# Patient Record
Sex: Male | Born: 1970 | Race: White | Hispanic: Yes | Smoking: Never smoker
Health system: Southern US, Community
[De-identification: ages and names within clinical notes are randomized; demographics above are authoritative.]

---

## 2019-08-29 ENCOUNTER — Emergency Department (HOSPITAL_COMMUNITY): Payer: Medicaid Other

## 2019-08-29 ENCOUNTER — Other Ambulatory Visit: Payer: Self-pay

## 2019-08-29 ENCOUNTER — Encounter (HOSPITAL_COMMUNITY): Payer: Self-pay

## 2019-08-29 ENCOUNTER — Inpatient Hospital Stay (HOSPITAL_COMMUNITY)
Admission: EM | Admit: 2019-08-29 | Discharge: 2019-09-09 | DRG: 870 | Disposition: E | Payer: Medicaid Other | Attending: Critical Care Medicine | Admitting: Critical Care Medicine

## 2019-08-29 DIAGNOSIS — J189 Pneumonia, unspecified organism: Secondary | ICD-10-CM

## 2019-08-29 DIAGNOSIS — I309 Acute pericarditis, unspecified: Secondary | ICD-10-CM | POA: Diagnosis present

## 2019-08-29 DIAGNOSIS — I248 Other forms of acute ischemic heart disease: Secondary | ICD-10-CM | POA: Diagnosis present

## 2019-08-29 DIAGNOSIS — R0902 Hypoxemia: Secondary | ICD-10-CM

## 2019-08-29 DIAGNOSIS — I4901 Ventricular fibrillation: Secondary | ICD-10-CM | POA: Diagnosis not present

## 2019-08-29 DIAGNOSIS — D649 Anemia, unspecified: Secondary | ICD-10-CM | POA: Diagnosis present

## 2019-08-29 DIAGNOSIS — G8929 Other chronic pain: Secondary | ICD-10-CM | POA: Diagnosis present

## 2019-08-29 DIAGNOSIS — T380X5A Adverse effect of glucocorticoids and synthetic analogues, initial encounter: Secondary | ICD-10-CM | POA: Diagnosis not present

## 2019-08-29 DIAGNOSIS — J9383 Other pneumothorax: Secondary | ICD-10-CM | POA: Diagnosis not present

## 2019-08-29 DIAGNOSIS — E871 Hypo-osmolality and hyponatremia: Secondary | ICD-10-CM | POA: Diagnosis present

## 2019-08-29 DIAGNOSIS — F1120 Opioid dependence, uncomplicated: Secondary | ICD-10-CM | POA: Diagnosis present

## 2019-08-29 DIAGNOSIS — R0689 Other abnormalities of breathing: Secondary | ICD-10-CM

## 2019-08-29 DIAGNOSIS — I301 Infective pericarditis: Secondary | ICD-10-CM | POA: Diagnosis present

## 2019-08-29 DIAGNOSIS — E873 Alkalosis: Secondary | ICD-10-CM | POA: Diagnosis present

## 2019-08-29 DIAGNOSIS — R34 Anuria and oliguria: Secondary | ICD-10-CM | POA: Diagnosis not present

## 2019-08-29 DIAGNOSIS — Z9689 Presence of other specified functional implants: Secondary | ICD-10-CM

## 2019-08-29 DIAGNOSIS — I2690 Septic pulmonary embolism without acute cor pulmonale: Secondary | ICD-10-CM | POA: Diagnosis present

## 2019-08-29 DIAGNOSIS — J9601 Acute respiratory failure with hypoxia: Secondary | ICD-10-CM | POA: Diagnosis present

## 2019-08-29 DIAGNOSIS — R0603 Acute respiratory distress: Secondary | ICD-10-CM

## 2019-08-29 DIAGNOSIS — I469 Cardiac arrest, cause unspecified: Secondary | ICD-10-CM | POA: Diagnosis not present

## 2019-08-29 DIAGNOSIS — I76 Septic arterial embolism: Secondary | ICD-10-CM | POA: Diagnosis present

## 2019-08-29 DIAGNOSIS — E872 Acidosis, unspecified: Secondary | ICD-10-CM

## 2019-08-29 DIAGNOSIS — R6520 Severe sepsis without septic shock: Secondary | ICD-10-CM

## 2019-08-29 DIAGNOSIS — J15212 Pneumonia due to Methicillin resistant Staphylococcus aureus: Secondary | ICD-10-CM | POA: Diagnosis present

## 2019-08-29 DIAGNOSIS — Z781 Physical restraint status: Secondary | ICD-10-CM

## 2019-08-29 DIAGNOSIS — J9600 Acute respiratory failure, unspecified whether with hypoxia or hypercapnia: Secondary | ICD-10-CM

## 2019-08-29 DIAGNOSIS — Z20828 Contact with and (suspected) exposure to other viral communicable diseases: Secondary | ICD-10-CM | POA: Diagnosis present

## 2019-08-29 DIAGNOSIS — R7989 Other specified abnormal findings of blood chemistry: Secondary | ICD-10-CM

## 2019-08-29 DIAGNOSIS — E875 Hyperkalemia: Secondary | ICD-10-CM | POA: Diagnosis not present

## 2019-08-29 DIAGNOSIS — E870 Hyperosmolality and hypernatremia: Secondary | ICD-10-CM | POA: Diagnosis not present

## 2019-08-29 DIAGNOSIS — E861 Hypovolemia: Secondary | ICD-10-CM | POA: Diagnosis present

## 2019-08-29 DIAGNOSIS — F1990 Other psychoactive substance use, unspecified, uncomplicated: Secondary | ICD-10-CM

## 2019-08-29 DIAGNOSIS — I480 Paroxysmal atrial fibrillation: Secondary | ICD-10-CM | POA: Diagnosis present

## 2019-08-29 DIAGNOSIS — E860 Dehydration: Secondary | ICD-10-CM | POA: Diagnosis present

## 2019-08-29 DIAGNOSIS — N170 Acute kidney failure with tubular necrosis: Secondary | ICD-10-CM | POA: Diagnosis present

## 2019-08-29 DIAGNOSIS — K5900 Constipation, unspecified: Secondary | ICD-10-CM | POA: Diagnosis not present

## 2019-08-29 DIAGNOSIS — J90 Pleural effusion, not elsewhere classified: Secondary | ICD-10-CM | POA: Diagnosis present

## 2019-08-29 DIAGNOSIS — Z20822 Contact with and (suspected) exposure to covid-19: Secondary | ICD-10-CM | POA: Diagnosis present

## 2019-08-29 DIAGNOSIS — R042 Hemoptysis: Secondary | ICD-10-CM | POA: Diagnosis present

## 2019-08-29 DIAGNOSIS — R7881 Bacteremia: Secondary | ICD-10-CM

## 2019-08-29 DIAGNOSIS — A419 Sepsis, unspecified organism: Secondary | ICD-10-CM

## 2019-08-29 DIAGNOSIS — B9710 Unspecified enterovirus as the cause of diseases classified elsewhere: Secondary | ICD-10-CM | POA: Diagnosis present

## 2019-08-29 DIAGNOSIS — Z86711 Personal history of pulmonary embolism: Secondary | ICD-10-CM

## 2019-08-29 DIAGNOSIS — I409 Acute myocarditis, unspecified: Secondary | ICD-10-CM

## 2019-08-29 DIAGNOSIS — N19 Unspecified kidney failure: Secondary | ICD-10-CM

## 2019-08-29 DIAGNOSIS — K7200 Acute and subacute hepatic failure without coma: Secondary | ICD-10-CM | POA: Diagnosis present

## 2019-08-29 DIAGNOSIS — L8990 Pressure ulcer of unspecified site, unspecified stage: Secondary | ICD-10-CM | POA: Insufficient documentation

## 2019-08-29 DIAGNOSIS — R739 Hyperglycemia, unspecified: Secondary | ICD-10-CM | POA: Diagnosis not present

## 2019-08-29 DIAGNOSIS — R339 Retention of urine, unspecified: Secondary | ICD-10-CM | POA: Diagnosis not present

## 2019-08-29 DIAGNOSIS — A4102 Sepsis due to Methicillin resistant Staphylococcus aureus: Principal | ICD-10-CM | POA: Diagnosis present

## 2019-08-29 DIAGNOSIS — R6521 Severe sepsis with septic shock: Secondary | ICD-10-CM | POA: Diagnosis present

## 2019-08-29 DIAGNOSIS — J939 Pneumothorax, unspecified: Secondary | ICD-10-CM

## 2019-08-29 LAB — CBC WITH DIFFERENTIAL/PLATELET
Abs Immature Granulocytes: 0 10*3/uL (ref 0.00–0.07)
Basophils Absolute: 0 10*3/uL (ref 0.0–0.1)
Basophils Relative: 0 %
Eosinophils Absolute: 0 10*3/uL (ref 0.0–0.5)
Eosinophils Relative: 0 %
HCT: 43.8 % (ref 39.0–52.0)
Hemoglobin: 15.1 g/dL (ref 13.0–17.0)
Lymphocytes Relative: 9 %
Lymphs Abs: 2.1 10*3/uL (ref 0.7–4.0)
MCH: 31.2 pg (ref 26.0–34.0)
MCHC: 34.5 g/dL (ref 30.0–36.0)
MCV: 90.5 fL (ref 80.0–100.0)
Monocytes Absolute: 1.4 10*3/uL — ABNORMAL HIGH (ref 0.1–1.0)
Monocytes Relative: 6 %
Neutro Abs: 20.2 10*3/uL — ABNORMAL HIGH (ref 1.7–7.7)
Neutrophils Relative %: 85 %
Platelets: 281 10*3/uL (ref 150–400)
RBC: 4.84 MIL/uL (ref 4.22–5.81)
RDW: 13.3 % (ref 11.5–15.5)
WBC: 23.8 10*3/uL — ABNORMAL HIGH (ref 4.0–10.5)
nRBC: 0 % (ref 0.0–0.2)
nRBC: 0 /100 WBC

## 2019-08-29 LAB — COMPREHENSIVE METABOLIC PANEL
ALT: 66 U/L — ABNORMAL HIGH (ref 0–44)
AST: 65 U/L — ABNORMAL HIGH (ref 15–41)
Albumin: 2.4 g/dL — ABNORMAL LOW (ref 3.5–5.0)
Alkaline Phosphatase: 128 U/L — ABNORMAL HIGH (ref 38–126)
Anion gap: 16 — ABNORMAL HIGH (ref 5–15)
BUN: 41 mg/dL — ABNORMAL HIGH (ref 6–20)
CO2: 22 mmol/L (ref 22–32)
Calcium: 8.8 mg/dL — ABNORMAL LOW (ref 8.9–10.3)
Chloride: 90 mmol/L — ABNORMAL LOW (ref 98–111)
Creatinine, Ser: 2.7 mg/dL — ABNORMAL HIGH (ref 0.61–1.24)
GFR calc Af Amer: 31 mL/min — ABNORMAL LOW (ref >60)
GFR calc non Af Amer: 27 mL/min — ABNORMAL LOW (ref >60)
Glucose, Bld: 176 mg/dL — ABNORMAL HIGH (ref 70–99)
Potassium: 4.4 mmol/L (ref 3.5–5.1)
Sodium: 128 mmol/L — ABNORMAL LOW (ref 135–145)
Total Bilirubin: 4.8 mg/dL — ABNORMAL HIGH (ref 0.3–1.2)
Total Protein: 7.9 g/dL (ref 6.5–8.1)

## 2019-08-29 LAB — LACTIC ACID, PLASMA
Lactic Acid, Venous: 2.8 mmol/L (ref 0.5–1.9)
Lactic Acid, Venous: 2.6 mmol/L (ref 0.5–1.9)

## 2019-08-29 LAB — PROTIME-INR
INR: 1.4 — ABNORMAL HIGH (ref 0.8–1.2)
Prothrombin Time: 16.8 seconds — ABNORMAL HIGH (ref 11.4–15.2)

## 2019-08-29 LAB — URINALYSIS, ROUTINE W REFLEX MICROSCOPIC
Bacteria, UA: NONE SEEN
Bilirubin Urine: NEGATIVE
Glucose, UA: NEGATIVE mg/dL
Ketones, ur: NEGATIVE mg/dL
Nitrite: NEGATIVE
Protein, ur: 100 mg/dL — AB
Specific Gravity, Urine: 1.019 (ref 1.005–1.030)
WBC, UA: >50 WBC/hpf — ABNORMAL HIGH (ref 0–5)
pH: 5 (ref 5.0–8.0)

## 2019-08-29 LAB — TROPONIN I (HIGH SENSITIVITY): Troponin I (High Sensitivity): 17 ng/L (ref <18)

## 2019-08-29 LAB — CBG MONITORING, ED: Glucose-Capillary: 173 mg/dL — ABNORMAL HIGH (ref 70–99)

## 2019-08-29 LAB — LIPASE, BLOOD: Lipase: 173 U/L — ABNORMAL HIGH (ref 11–51)

## 2019-08-29 LAB — APTT: aPTT: 44 seconds — ABNORMAL HIGH (ref 24–36)

## 2019-08-29 MED ORDER — SODIUM CHLORIDE 0.9 % IV SOLN
500.0000 mg | INTRAVENOUS | Status: DC
  Administered 2019-08-29: 20:00:00 500 mg via INTRAVENOUS
  Filled 2019-08-29: qty 500

## 2019-08-29 MED ORDER — LACTATED RINGERS IV BOLUS (SEPSIS)
500.0000 mL | Freq: Once | INTRAVENOUS | Status: AC
  Administered 2019-08-29: 20:00:00 500 mL via INTRAVENOUS

## 2019-08-29 MED ORDER — LACTATED RINGERS IV BOLUS (SEPSIS)
1000.0000 mL | Freq: Once | INTRAVENOUS | Status: AC
  Administered 2019-08-29: 1000 mL via INTRAVENOUS

## 2019-08-29 MED ORDER — SODIUM CHLORIDE 0.9 % IV SOLN
2.0000 g | INTRAVENOUS | Status: DC
  Administered 2019-08-29: 2 g via INTRAVENOUS
  Filled 2019-08-29: qty 20

## 2019-08-29 MED ORDER — ONDANSETRON HCL 4 MG/2ML IJ SOLN
4.0000 mg | Freq: Once | INTRAMUSCULAR | Status: AC
  Administered 2019-08-29: 20:00:00 4 mg via INTRAVENOUS
  Filled 2019-08-29: qty 2

## 2019-08-29 MED ORDER — MORPHINE SULFATE (PF) 4 MG/ML IV SOLN
4.0000 mg | Freq: Once | INTRAVENOUS | Status: AC
  Administered 2019-08-29: 4 mg via INTRAVENOUS
  Filled 2019-08-29: qty 1

## 2019-08-29 NOTE — ED Notes (Signed)
Called lab to add on APTT and lipase

## 2019-08-29 NOTE — ED Notes (Signed)
Patient transported to CT 

## 2019-08-29 NOTE — ED Notes (Signed)
Pt given urinal and advised of the need for urine

## 2019-08-29 NOTE — ED Notes (Signed)
ED Provider at bedside. 

## 2019-08-29 NOTE — ED Triage Notes (Signed)
Pt reports right sided chest pain and shortness of breath for 3 days, was told by his friend that he had pneumonia and needed to come here. Pt diaphoretic, hypotensive and tachycardic in triage. 18G IV placed in RAC.

## 2019-08-30 ENCOUNTER — Encounter (HOSPITAL_COMMUNITY): Payer: Self-pay | Admitting: Internal Medicine

## 2019-08-30 ENCOUNTER — Inpatient Hospital Stay (HOSPITAL_COMMUNITY): Payer: Medicaid Other

## 2019-08-30 ENCOUNTER — Other Ambulatory Visit (HOSPITAL_COMMUNITY): Payer: Self-pay

## 2019-08-30 DIAGNOSIS — I330 Acute and subacute infective endocarditis: Secondary | ICD-10-CM

## 2019-08-30 DIAGNOSIS — K7200 Acute and subacute hepatic failure without coma: Secondary | ICD-10-CM | POA: Diagnosis present

## 2019-08-30 DIAGNOSIS — I309 Acute pericarditis, unspecified: Secondary | ICD-10-CM | POA: Diagnosis present

## 2019-08-30 DIAGNOSIS — G8929 Other chronic pain: Secondary | ICD-10-CM | POA: Diagnosis present

## 2019-08-30 DIAGNOSIS — R042 Hemoptysis: Secondary | ICD-10-CM

## 2019-08-30 DIAGNOSIS — R0603 Acute respiratory distress: Secondary | ICD-10-CM

## 2019-08-30 DIAGNOSIS — A419 Sepsis, unspecified organism: Secondary | ICD-10-CM | POA: Diagnosis not present

## 2019-08-30 DIAGNOSIS — R6521 Severe sepsis with septic shock: Secondary | ICD-10-CM | POA: Diagnosis present

## 2019-08-30 DIAGNOSIS — I76 Septic arterial embolism: Secondary | ICD-10-CM | POA: Diagnosis present

## 2019-08-30 DIAGNOSIS — R6520 Severe sepsis without septic shock: Secondary | ICD-10-CM

## 2019-08-30 DIAGNOSIS — E860 Dehydration: Secondary | ICD-10-CM | POA: Diagnosis present

## 2019-08-30 DIAGNOSIS — J90 Pleural effusion, not elsewhere classified: Secondary | ICD-10-CM | POA: Diagnosis present

## 2019-08-30 DIAGNOSIS — J15212 Pneumonia due to Methicillin resistant Staphylococcus aureus: Secondary | ICD-10-CM | POA: Diagnosis present

## 2019-08-30 DIAGNOSIS — R739 Hyperglycemia, unspecified: Secondary | ICD-10-CM | POA: Diagnosis not present

## 2019-08-30 DIAGNOSIS — Z86711 Personal history of pulmonary embolism: Secondary | ICD-10-CM | POA: Diagnosis not present

## 2019-08-30 DIAGNOSIS — N179 Acute kidney failure, unspecified: Secondary | ICD-10-CM | POA: Diagnosis not present

## 2019-08-30 DIAGNOSIS — R579 Shock, unspecified: Secondary | ICD-10-CM | POA: Diagnosis not present

## 2019-08-30 DIAGNOSIS — N170 Acute kidney failure with tubular necrosis: Secondary | ICD-10-CM | POA: Diagnosis present

## 2019-08-30 DIAGNOSIS — J9601 Acute respiratory failure with hypoxia: Secondary | ICD-10-CM | POA: Diagnosis present

## 2019-08-30 DIAGNOSIS — E871 Hypo-osmolality and hyponatremia: Secondary | ICD-10-CM | POA: Diagnosis present

## 2019-08-30 DIAGNOSIS — E873 Alkalosis: Secondary | ICD-10-CM | POA: Diagnosis present

## 2019-08-30 DIAGNOSIS — E872 Acidosis, unspecified: Secondary | ICD-10-CM | POA: Insufficient documentation

## 2019-08-30 DIAGNOSIS — J189 Pneumonia, unspecified organism: Secondary | ICD-10-CM | POA: Diagnosis not present

## 2019-08-30 DIAGNOSIS — F1120 Opioid dependence, uncomplicated: Secondary | ICD-10-CM | POA: Diagnosis present

## 2019-08-30 DIAGNOSIS — R7881 Bacteremia: Secondary | ICD-10-CM | POA: Diagnosis not present

## 2019-08-30 DIAGNOSIS — J9383 Other pneumothorax: Secondary | ICD-10-CM | POA: Diagnosis not present

## 2019-08-30 DIAGNOSIS — I301 Infective pericarditis: Secondary | ICD-10-CM | POA: Diagnosis present

## 2019-08-30 DIAGNOSIS — A4102 Sepsis due to Methicillin resistant Staphylococcus aureus: Secondary | ICD-10-CM | POA: Diagnosis present

## 2019-08-30 DIAGNOSIS — Z20828 Contact with and (suspected) exposure to other viral communicable diseases: Secondary | ICD-10-CM | POA: Diagnosis present

## 2019-08-30 DIAGNOSIS — E870 Hyperosmolality and hypernatremia: Secondary | ICD-10-CM | POA: Diagnosis not present

## 2019-08-30 DIAGNOSIS — I2690 Septic pulmonary embolism without acute cor pulmonale: Secondary | ICD-10-CM | POA: Diagnosis present

## 2019-08-30 DIAGNOSIS — I480 Paroxysmal atrial fibrillation: Secondary | ICD-10-CM | POA: Diagnosis not present

## 2019-08-30 DIAGNOSIS — I248 Other forms of acute ischemic heart disease: Secondary | ICD-10-CM | POA: Diagnosis present

## 2019-08-30 DIAGNOSIS — Z20822 Contact with and (suspected) exposure to covid-19: Secondary | ICD-10-CM | POA: Diagnosis present

## 2019-08-30 DIAGNOSIS — Z9689 Presence of other specified functional implants: Secondary | ICD-10-CM | POA: Diagnosis not present

## 2019-08-30 LAB — RESPIRATORY PANEL BY PCR

## 2019-08-30 LAB — BLOOD GAS, ARTERIAL
Acid-base deficit: 3.9 mmol/L — ABNORMAL HIGH (ref 0.0–2.0)
Bicarbonate: 20.2 mmol/L (ref 20.0–28.0)
O2 Content: 5 L/min
O2 Saturation: 95.8 %
Patient temperature: 98.6
pCO2 arterial: 33.8 mmHg (ref 32.0–48.0)
pH, Arterial: 7.393 (ref 7.350–7.450)
pO2, Arterial: 81.3 mmHg — ABNORMAL LOW (ref 83.0–108.0)

## 2019-08-30 LAB — BLOOD CULTURE ID PANEL (REFLEXED)

## 2019-08-30 LAB — COMPREHENSIVE METABOLIC PANEL
ALT: 51 U/L — ABNORMAL HIGH (ref 0–44)
AST: 67 U/L — ABNORMAL HIGH (ref 15–41)
Albumin: 2.1 g/dL — ABNORMAL LOW (ref 3.5–5.0)
Alkaline Phosphatase: 94 U/L (ref 38–126)
Anion gap: 13 (ref 5–15)
BUN: 33 mg/dL — ABNORMAL HIGH (ref 6–20)
CO2: 18 mmol/L — ABNORMAL LOW (ref 22–32)
Calcium: 8.1 mg/dL — ABNORMAL LOW (ref 8.9–10.3)
Chloride: 103 mmol/L (ref 98–111)
Creatinine, Ser: 1.59 mg/dL — ABNORMAL HIGH (ref 0.61–1.24)
GFR calc Af Amer: 59 mL/min — ABNORMAL LOW (ref >60)
GFR calc non Af Amer: 51 mL/min — ABNORMAL LOW (ref >60)
Glucose, Bld: 169 mg/dL — ABNORMAL HIGH (ref 70–99)
Potassium: 4.5 mmol/L (ref 3.5–5.1)
Sodium: 134 mmol/L — ABNORMAL LOW (ref 135–145)
Total Bilirubin: 4.4 mg/dL — ABNORMAL HIGH (ref 0.3–1.2)
Total Protein: 6.4 g/dL — ABNORMAL LOW (ref 6.5–8.1)

## 2019-08-30 LAB — FIBRINOGEN: Fibrinogen: 711 mg/dL — ABNORMAL HIGH (ref 210–475)

## 2019-08-30 LAB — HEPATIC FUNCTION PANEL
ALT: 57 U/L — ABNORMAL HIGH (ref 0–44)
AST: 68 U/L — ABNORMAL HIGH (ref 15–41)
Albumin: 2.2 g/dL — ABNORMAL LOW (ref 3.5–5.0)
Alkaline Phosphatase: 111 U/L (ref 38–126)
Bilirubin, Direct: 2.7 mg/dL — ABNORMAL HIGH (ref 0.0–0.2)
Indirect Bilirubin: 2.1 mg/dL — ABNORMAL HIGH (ref 0.3–0.9)
Total Bilirubin: 4.8 mg/dL — ABNORMAL HIGH (ref 0.3–1.2)
Total Protein: 7.2 g/dL (ref 6.5–8.1)

## 2019-08-30 LAB — CBC
HCT: 42.9 % (ref 39.0–52.0)
Hemoglobin: 15.1 g/dL (ref 13.0–17.0)
MCH: 31.7 pg (ref 26.0–34.0)
MCHC: 35.2 g/dL (ref 30.0–36.0)
MCV: 89.9 fL (ref 80.0–100.0)
Platelets: 247 10*3/uL (ref 150–400)
RBC: 4.77 MIL/uL (ref 4.22–5.81)
RDW: 13.4 % (ref 11.5–15.5)
WBC: 22.4 10*3/uL — ABNORMAL HIGH (ref 4.0–10.5)
nRBC: 0 % (ref 0.0–0.2)

## 2019-08-30 LAB — BASIC METABOLIC PANEL
Anion gap: 15 (ref 5–15)
BUN: 35 mg/dL — ABNORMAL HIGH (ref 6–20)
CO2: 20 mmol/L — ABNORMAL LOW (ref 22–32)
Calcium: 8.5 mg/dL — ABNORMAL LOW (ref 8.9–10.3)
Chloride: 95 mmol/L — ABNORMAL LOW (ref 98–111)
Creatinine, Ser: 1.85 mg/dL — ABNORMAL HIGH (ref 0.61–1.24)
GFR calc Af Amer: 49 mL/min — ABNORMAL LOW (ref >60)
GFR calc non Af Amer: 42 mL/min — ABNORMAL LOW (ref >60)
Glucose, Bld: 102 mg/dL — ABNORMAL HIGH (ref 70–99)
Potassium: 5.1 mmol/L (ref 3.5–5.1)
Sodium: 130 mmol/L — ABNORMAL LOW (ref 135–145)

## 2019-08-30 LAB — POCT I-STAT 7, (LYTES, BLD GAS, ICA,H+H)
Acid-base deficit: 2 mmol/L (ref 0.0–2.0)
Bicarbonate: 20.3 mmol/L (ref 20.0–28.0)
Calcium, Ion: 1.14 mmol/L — ABNORMAL LOW (ref 1.15–1.40)
HCT: 39 % (ref 39.0–52.0)
Hemoglobin: 13.3 g/dL (ref 13.0–17.0)
O2 Saturation: 98 %
Patient temperature: 99.9
Potassium: 4.2 mmol/L (ref 3.5–5.1)
Sodium: 130 mmol/L — ABNORMAL LOW (ref 135–145)
TCO2: 21 mmol/L — ABNORMAL LOW (ref 22–32)
pCO2 arterial: 29.8 mmHg — ABNORMAL LOW (ref 32.0–48.0)
pH, Arterial: 7.444 (ref 7.350–7.450)
pO2, Arterial: 97 mmHg (ref 83.0–108.0)

## 2019-08-30 LAB — HIV ANTIBODY (ROUTINE TESTING W REFLEX): HIV Screen 4th Generation wRfx: NONREACTIVE

## 2019-08-30 LAB — TRIGLYCERIDES: Triglycerides: 163 mg/dL — ABNORMAL HIGH (ref <150)

## 2019-08-30 LAB — SARS CORONAVIRUS 2 (TAT 6-24 HRS)
SARS Coronavirus 2: NEGATIVE
SARS Coronavirus 2: NEGATIVE

## 2019-08-30 LAB — TROPONIN I (HIGH SENSITIVITY)
Troponin I (High Sensitivity): 87 ng/L — ABNORMAL HIGH (ref <18)
Troponin I (High Sensitivity): 79 ng/L — ABNORMAL HIGH (ref <18)
Troponin I (High Sensitivity): 101 ng/L (ref <18)
Troponin I (High Sensitivity): 71 ng/L — ABNORMAL HIGH (ref <18)

## 2019-08-30 LAB — PROCALCITONIN: Procalcitonin: 7.65 ng/mL

## 2019-08-30 LAB — FERRITIN: Ferritin: 642 ng/mL — ABNORMAL HIGH (ref 24–336)

## 2019-08-30 LAB — OSMOLALITY: Osmolality: 287 mOsm/kg (ref 275–295)

## 2019-08-30 LAB — ECHOCARDIOGRAM COMPLETE
Height: 68 in
Weight: 2880 oz

## 2019-08-30 LAB — CBG MONITORING, ED: Glucose-Capillary: 93 mg/dL (ref 70–99)

## 2019-08-30 LAB — GLUCOSE, CAPILLARY
Glucose-Capillary: 171 mg/dL — ABNORMAL HIGH (ref 70–99)
Glucose-Capillary: 130 mg/dL — ABNORMAL HIGH (ref 70–99)

## 2019-08-30 LAB — LACTIC ACID, PLASMA
Lactic Acid, Venous: 3.2 mmol/L (ref 0.5–1.9)
Lactic Acid, Venous: 2.8 mmol/L (ref 0.5–1.9)
Lactic Acid, Venous: 3.3 mmol/L (ref 0.5–1.9)
Lactic Acid, Venous: 3.7 mmol/L (ref 0.5–1.9)

## 2019-08-30 LAB — D-DIMER, QUANTITATIVE: D-Dimer, Quant: 9.44 ug/mL-FEU — ABNORMAL HIGH (ref 0.00–0.50)

## 2019-08-30 LAB — LACTATE DEHYDROGENASE
LDH: 282 U/L — ABNORMAL HIGH (ref 98–192)
LDH: 227 U/L — ABNORMAL HIGH (ref 98–192)

## 2019-08-30 LAB — MRSA PCR SCREENING: MRSA by PCR: NEGATIVE

## 2019-08-30 LAB — ABO/RH: ABO/RH(D): B POS

## 2019-08-30 LAB — C-REACTIVE PROTEIN: CRP: 22.2 mg/dL — ABNORMAL HIGH (ref <1.0)

## 2019-08-30 MED ORDER — CHLORHEXIDINE GLUCONATE CLOTH 2 % EX PADS
6.0000 | MEDICATED_PAD | Freq: Every day | CUTANEOUS | Status: DC
  Administered 2019-08-30 – 2019-08-31 (×2): 6 via TOPICAL

## 2019-08-30 MED ORDER — DIGOXIN 0.25 MG/ML IJ SOLN
0.1250 mg | Freq: Once | INTRAMUSCULAR | Status: AC
  Administered 2019-08-30: 0.125 mg via INTRAVENOUS
  Filled 2019-08-30: qty 2

## 2019-08-30 MED ORDER — DEXMEDETOMIDINE HCL IN NACL 400 MCG/100ML IV SOLN
0.2000 ug/kg/h | INTRAVENOUS | Status: DC
  Administered 2019-08-30: 0.4 ug/kg/h via INTRAVENOUS
  Administered 2019-08-30: 0.5 ug/kg/h via INTRAVENOUS
  Filled 2019-08-30 (×3): qty 100

## 2019-08-30 MED ORDER — SODIUM CHLORIDE 0.9 % IV SOLN
2.0000 g | Freq: Two times a day (BID) | INTRAVENOUS | Status: DC
  Administered 2019-08-30: 07:00:00 2 g via INTRAVENOUS
  Filled 2019-08-30 (×2): qty 2

## 2019-08-30 MED ORDER — CHLORHEXIDINE GLUCONATE CLOTH 2 % EX PADS
6.0000 | MEDICATED_PAD | Freq: Every day | CUTANEOUS | Status: DC
  Administered 2019-08-31 – 2019-09-03 (×3): 6 via TOPICAL

## 2019-08-30 MED ORDER — ORAL CARE MOUTH RINSE
15.0000 mL | Freq: Two times a day (BID) | OROMUCOSAL | Status: DC
  Administered 2019-08-30: 22:00:00 15 mL via OROMUCOSAL

## 2019-08-30 MED ORDER — ACETAMINOPHEN 650 MG RE SUPP: 650.0000 mg | Freq: Four times a day (QID) | RECTAL | Status: DC | PRN

## 2019-08-30 MED ORDER — VANCOMYCIN VARIABLE DOSE PER UNSTABLE RENAL FUNCTION (PHARMACIST DOSING): Status: DC

## 2019-08-30 MED ORDER — METOPROLOL TARTRATE 5 MG/5ML IV SOLN: 2.5000 mg | Freq: Once | INTRAVENOUS | Status: DC

## 2019-08-30 MED ORDER — SODIUM CHLORIDE 0.9 % IV BOLUS
2000.0000 mL | Freq: Once | INTRAVENOUS | Status: AC
  Administered 2019-08-30: 2000 mL via INTRAVENOUS

## 2019-08-30 MED ORDER — SODIUM CHLORIDE 0.9 % IV BOLUS
1000.0000 mL | Freq: Once | INTRAVENOUS | Status: AC
  Administered 2019-08-30: 1000 mL via INTRAVENOUS

## 2019-08-30 MED ORDER — METOPROLOL TARTRATE 5 MG/5ML IV SOLN: 2.5000 mg | INTRAVENOUS | Status: DC | PRN

## 2019-08-30 MED ORDER — IOHEXOL 350 MG/ML SOLN
75.0000 mL | Freq: Once | INTRAVENOUS | Status: AC | PRN
  Administered 2019-08-30: 06:00:00 75 mL via INTRAVENOUS

## 2019-08-30 MED ORDER — COLCHICINE 0.6 MG PO TABS
0.3000 mg | ORAL_TABLET | Freq: Once | ORAL | Status: DC
  Filled 2019-08-30: qty 0.5

## 2019-08-30 MED ORDER — MORPHINE SULFATE (PF) 2 MG/ML IV SOLN: 1.0000 mg | INTRAVENOUS | Status: DC | PRN

## 2019-08-30 MED ORDER — MUPIROCIN 2 % EX OINT
1.0000 "application " | TOPICAL_OINTMENT | Freq: Two times a day (BID) | CUTANEOUS | Status: AC
  Administered 2019-08-30 – 2019-09-03 (×10): 1 via NASAL
  Filled 2019-08-30 (×3): qty 22

## 2019-08-30 MED ORDER — ALBUMIN HUMAN 5 % IV SOLN
12.5000 g | Freq: Once | INTRAVENOUS | Status: AC
  Administered 2019-08-30: 12.5 g via INTRAVENOUS
  Filled 2019-08-30: qty 250

## 2019-08-30 MED ORDER — ACETAMINOPHEN 325 MG PO TABS
650.0000 mg | ORAL_TABLET | Freq: Four times a day (QID) | ORAL | Status: DC | PRN
  Administered 2019-08-30 – 2019-09-03 (×5): 650 mg via ORAL
  Filled 2019-08-30 (×5): qty 2

## 2019-08-30 MED ORDER — PREDNISONE 20 MG PO TABS
40.0000 mg | ORAL_TABLET | Freq: Every day | ORAL | Status: DC
  Administered 2019-08-30: 40 mg via ORAL
  Filled 2019-08-30: qty 2

## 2019-08-30 MED ORDER — FENTANYL CITRATE (PF) 100 MCG/2ML IJ SOLN
25.0000 ug | Freq: Once | INTRAMUSCULAR | Status: AC
  Administered 2019-08-30: 02:00:00 25 ug via INTRAVENOUS
  Filled 2019-08-30: qty 2

## 2019-08-30 MED ORDER — VANCOMYCIN HCL 10 G IV SOLR
1250.0000 mg | INTRAVENOUS | Status: DC
  Administered 2019-08-31: 10:00:00 1250 mg via INTRAVENOUS
  Filled 2019-08-30 (×2): qty 1250

## 2019-08-30 MED ORDER — FENTANYL CITRATE (PF) 100 MCG/2ML IJ SOLN: 25.0000 ug | INTRAMUSCULAR | Status: DC | PRN

## 2019-08-30 MED ORDER — VANCOMYCIN HCL 10 G IV SOLR
1500.0000 mg | Freq: Once | INTRAVENOUS | Status: AC
  Administered 2019-08-30: 1500 mg via INTRAVENOUS
  Filled 2019-08-30: qty 1500

## 2019-08-30 MED ORDER — COLCHICINE 0.6 MG PO TABS
0.6000 mg | ORAL_TABLET | Freq: Two times a day (BID) | ORAL | Status: DC
  Administered 2019-08-31 – 2019-09-04 (×9): 0.6 mg via ORAL
  Filled 2019-08-30 (×13): qty 1

## 2019-08-30 MED ORDER — SODIUM CHLORIDE 0.9 % IV SOLN
INTRAVENOUS | Status: DC
  Administered 2019-08-30: 08:00:00 via INTRAVENOUS

## 2019-08-30 MED ORDER — FENTANYL CITRATE (PF) 100 MCG/2ML IJ SOLN
12.5000 ug | INTRAMUSCULAR | Status: DC | PRN
  Administered 2019-08-30: 12.5 ug via INTRAVENOUS
  Filled 2019-08-30: qty 2

## 2019-08-30 MED ORDER — HYDROCODONE-ACETAMINOPHEN 5-325 MG PO TABS
1.0000 | ORAL_TABLET | Freq: Once | ORAL | Status: AC
  Administered 2019-08-30: 1 via ORAL
  Filled 2019-08-30: qty 1

## 2019-08-30 MED ORDER — COLCHICINE 0.6 MG PO TABS
0.3000 mg | ORAL_TABLET | Freq: Every day | ORAL | Status: DC
  Administered 2019-08-30: 0.3 mg via ORAL

## 2019-08-30 MED ORDER — SODIUM CHLORIDE 0.9 % IV BOLUS
500.0000 mL | Freq: Once | INTRAVENOUS | Status: AC
  Administered 2019-08-30: 500 mL via INTRAVENOUS

## 2019-08-30 MED ORDER — COLCHICINE 0.6 MG PO TABS
0.3000 mg | ORAL_TABLET | Freq: Two times a day (BID) | ORAL | Status: DC
  Administered 2019-08-30 (×2): 0.3 mg via ORAL
  Filled 2019-08-30 (×4): qty 0.5

## 2019-08-30 NOTE — Consult Note (Addendum)
[facility]  Total days of antibiotics 1/vanco/cefepime         Reason for Consult: MRSA bacteremia    Referring Physician: Blake Divine  Principal Problem:   Suspected COVID-19 virus infection Active Problems:   Severe sepsis (HCC)   Acute viral pericarditis   Respiratory distress   Hemoptysis    HPI: Scott Strong is a 48 y.o. male with history of daily iv heroin use presents with  right sided chest pain and shortness of breath for 3 days, was told by his friend that he had pneumonia and needed to come here. Pt diaphoretic, hypotensive and tachycardic while in the ED. He was found to have temp of 103F, tachycardia, tachypnea, RR 40 but not hypoxic with SBP 88-125. LA elevated. EKG showing diffuse ST elevation concerning for viral myocarditis after review with cardiology. Troponin +, and his CXR and CT showing multifocal cavitary pneumonia c/w septic emboli and small pericardial effusion and small pleural effusion R>L. His initial covid test was negative, but given CT findings they repeated his test. Lab findings shows leukocytosis of 22, with aki 2.7  He was started on vancomycin and cefepime. Infectious work up showed blood cx + MRSA.   History reviewed. No pertinent past medical history.  Allergies: No Known Allergies   MEDICATIONS:  colchicine  0.3 mg Oral Daily   predniSONE  40 mg Oral Q breakfast    Social History   Tobacco Use   Smoking status: Never Smoker   Smokeless tobacco: Never Used  Substance Use Topics   Alcohol use: Not Currently   Drug use: Yes    Types: IV, Heroin    Family hx: cad, dm  Review of Systems  Constitutional: positive for fever, chills, diaphoresis, activity change, appetite change, fatigue and unexpected weight change.  HENT: Negative for congestion, sore throat, rhinorrhea, sneezing, trouble swallowing and sinus pressure.  Eyes: Negative for photophobia and visual disturbance.  Respiratory: positive for cough,  chest tightness, shortness of breath, wheezing and stridor.  Cardiovascular: positive for chest pain, palpitations and leg swelling.  Gastrointestinal: Negative for nausea, vomiting, abdominal pain, diarrhea, constipation, blood in stool, abdominal distention and anal bleeding.  Genitourinary: Negative for dysuria, hematuria, flank pain and difficulty urinating.  Musculoskeletal: Negative for myalgias, back pain, joint swelling, arthralgias and gait problem.  Skin: +rash to torsoNegative for color change, pallor, rash and wound.  Neurological: Negative for dizziness, tremors, weakness and light-headedness.  Hematological: Negative for adenopathy. Does not bruise/bleed easily.  Psychiatric/Behavioral: Negative for behavioral problems, confusion, sleep disturbance, dysphoric mood, decreased concentration and agitation.    OBJECTIVE: Temp:  [97.6 F (36.4 C)-102.4 F (39.1 C)] 97.6 F (36.4 C) (10/22 1130) Pulse Rate:  [26-152] 110 (10/22 1215) Resp:  [10-55] 29 (10/22 1215) BP: (58-125)/(32-114) 105/66 (10/22 1215) SpO2:  [83 %-100 %] 95 % (10/22 1215) Weight:  [81.6 kg] 81.6 kg (10/21 1845) Physical Exam  Constitutional: He is oriented to person, place, and time. He appears well-developed and well-nourished. No distress.  HENT:  Mouth/Throat: Oropharynx is clear and moist. No oropharyngeal exudate. Poor dentition Cardiovascular: tachycardia, regular rhythm and normal heart sounds. Exam reveals no gallop and no friction rub.  No murmur heard.  Pulmonary/Chest: tachypnea, respiratory distress. Bilateral rhonchiHe has no wheezes.  Abdominal: Soft. Bowel sounds are normal. He exhibits no distension. There is no tenderness.  Lymphadenopathy:  He has no cervical adenopathy.  Neurological: He is alert and oriented to person, place, and time.  Skin: Skin  is warm and dry.numerous tattoos, cool feet bilaterally. Possible splinter hemorrhage to feet Psychiatric: He has a normal mood and affect.  His behavior is normal.     LABS: Results for orders placed or performed during the hospital encounter of 09/14/19 (from the past 48 hour(s))  Culture, blood (Routine x 2)     Status: None (Preliminary result)   Collection Time: 09/14/2019  6:30 PM   Specimen: BLOOD  Result Value Ref Range   Specimen Description BLOOD RIGHT ANTECUBITAL    Special Requests      BOTTLES DRAWN AEROBIC AND ANAEROBIC Blood Culture adequate volume   Culture  Setup Time      IN BOTH AEROBIC AND ANAEROBIC BOTTLES GRAM POSITIVE COCCI CRITICAL RESULT CALLED TO, READ BACK BY AND VERIFIED WITH: G ABBOTT PHARMD 08/30/19 0717 JDW    Culture      CULTURE REINCUBATED FOR BETTER GROWTH Performed at [facility], [address], [place], [place] [address]    Report Status PENDING   CBG monitoring, ED     Status: Abnormal   Collection Time: 09/14/2019  6:34 PM  Result Value Ref Range   Glucose-Capillary 173 (H) 70 - 99 mg/dL  Comprehensive metabolic panel     Status: Abnormal   Collection Time: 09/14/19  6:38 PM  Result Value Ref Range   Sodium 128 (L) 135 - 145 mmol/L   Potassium 4.4 3.5 - 5.1 mmol/L   Chloride 90 (L) 98 - 111 mmol/L   CO2 22 22 - 32 mmol/L   Glucose, Bld 176 (H) 70 - 99 mg/dL   BUN 41 (H) 6 - 20 mg/dL   Creatinine, Ser 6.04 (H) 0.61 - 1.24 mg/dL   Calcium 8.8 (L) 8.9 - 10.3 mg/dL   Total Protein 7.9 6.5 - 8.1 g/dL   Albumin 2.4 (L) 3.5 - 5.0 g/dL   AST 65 (H) 15 - 41 U/L   ALT 66 (H) 0 - 44 U/L   Alkaline Phosphatase 128 (H) 38 - 126 U/L   Total Bilirubin 4.8 (H) 0.3 - 1.2 mg/dL   GFR calc non Af Amer 27 (L) >60 mL/min   GFR calc Af Amer 31 (L) >60 mL/min   Anion gap 16 (H) 5 - 15    Comment: Performed at [facility], [address] [place], [place] [address]  Lactic acid, plasma     Status: Abnormal   Collection Time: September 14, 2019  6:38 PM  Result Value Ref Range   Lactic Acid, Venous 2.8 (HH) 0.5 - 1.9 mmol/L    Comment: CRITICAL RESULT CALLED TO, READ BACK BY AND VERIFIED  WITH:  Lajuana Carry 2019/09/14 WBOND  Performed at [facility], [address], [place], [place] [address]   CBC with Differential     Status: Abnormal   Collection Time: 09-14-2019  6:38 PM  Result Value Ref Range   WBC 23.8 (H) 4.0 - 10.5 K/uL   RBC 4.84 4.22 - 5.81 MIL/uL   Hemoglobin 15.1 13.0 - 17.0 g/dL   HCT 78.2 95.6 - 21.3 %   MCV 90.5 80.0 - 100.0 fL   MCH 31.2 26.0 - 34.0 pg   MCHC 34.5 30.0 - 36.0 g/dL   RDW 08.6 57.8 - 46.9 %   Platelets 281 150 - 400 K/uL   nRBC 0.0 0.0 - 0.2 %   Neutrophils Relative % 85 %   Neutro Abs 20.2 (H) 1.7 - 7.7 K/uL   Lymphocytes Relative  9 %   Lymphs Abs 2.1 0.7 - 4.0 K/uL   Monocytes Relative 6 %   Monocytes Absolute 1.4 (H) 0.1 - 1.0 K/uL   Eosinophils Relative 0 %   Eosinophils Absolute 0.0 0.0 - 0.5 K/uL   Basophils Relative 0 %   Basophils Absolute 0.0 0.0 - 0.1 K/uL   WBC Morphology See Note     Comment: Increased Bands. >20% Bands  Vaculated Neutrophils    nRBC 0 0 /100 WBC   Abs Immature Granulocytes 0.00 0.00 - 0.07 K/uL    Comment: Performed at [facility], [address], [place], [place] [address]  Protime-INR     Status: Abnormal   Collection Time: 08/24/2019  6:38 PM  Result Value Ref Range   Prothrombin Time 16.8 (H) 11.4 - 15.2 seconds   INR 1.4 (H) 0.8 - 1.2    Comment: (NOTE) INR goal varies based on device and disease states. Performed at [facility], [address] [place], [place] [address]   Troponin I (High Sensitivity)     Status: None   Collection Time: 08/16/2019  6:38 PM  Result Value Ref Range   Troponin I (High Sensitivity) 17 <18 ng/L    Comment: (NOTE) Elevated high sensitivity troponin I (hsTnI) values and significant  changes across serial measurements may suggest ACS but many other  chronic and acute conditions are known to elevate hsTnI results.  Refer to the "Links" section for chest pain algorithms and additional  guidance. Performed at [facility],  [address], [place], [place] [address]   APTT     Status: Abnormal   Collection Time: 08/09/2019  6:38 PM  Result Value Ref Range   aPTT 44 (H) 24 - 36 seconds    Comment:        IF BASELINE aPTT IS ELEVATED, SUGGEST PATIENT RISK ASSESSMENT BE USED TO DETERMINE APPROPRIATE ANTICOAGULANT THERAPY. Performed at [facility], [address], [place], [place] [address]   Lipase, blood     Status: Abnormal   Collection Time: 08/15/2019  6:38 PM  Result Value Ref Range   Lipase 173 (H) 11 - 51 U/L    Comment: Performed at [facility], [address], [place], [place] [address]  Culture, blood (Routine x 2)     Status: None (Preliminary result)   Collection Time: 09/02/2019  6:56 PM   Specimen: BLOOD RIGHT HAND  Result Value Ref Range   Specimen Description BLOOD RIGHT HAND    Special Requests      BOTTLES DRAWN AEROBIC ONLY Blood Culture results may not be optimal due to an inadequate volume of blood received in culture bottles   Culture  Setup Time      GRAM POSITIVE COCCI IN CLUSTERS AEROBIC BOTTLE ONLY CRITICAL RESULT CALLED TO, READ BACK BY AND VERIFIED WITH: Donnie Mesa, AT 1308 08/30/19 Organism ID to follow Performed at [facility], [address], [place], [place] [address]    Culture GRAM POSITIVE COCCI    Report Status PENDING   Blood Culture ID Panel (Reflexed)     Status: Abnormal   Collection Time: 08/11/2019  6:56 PM  Result Value Ref Range   Enterococcus species NOT DETECTED NOT DETECTED   Listeria monocytogenes NOT DETECTED NOT DETECTED   Staphylococcus species DETECTED (A) NOT DETECTED    Comment: CRITICAL RESULT CALLED TO, READ BACK BY AND VERIFIED WITH: Artelia Laroche PharmD 10:30 08/29/29 (  [email or web address])    Staphylococcus aureus (BCID) DETECTED (A) NOT DETECTED    Comment: Methicillin (oxacillin)-resistant Staphylococcus aureus (MRSA). MRSA is predictably resistant to beta-lactam antibiotics (except ceftaroline). Preferred therapy is vancomycin unless  clinically contraindicated. Patient requires contact precautions if  hospitalized. CRITICAL RESULT CALLED TO, READ BACK BY AND VERIFIED WITH: Artelia Laroche PharmD 10:30 08/30/19 ([email or web address])    Methicillin resistance DETECTED (A) NOT DETECTED    Comment: CRITICAL RESULT CALLED TO, READ BACK BY AND VERIFIED WITH: Artelia Laroche PharmD 10:30 08/30/19 ([email or web address])    Streptococcus species NOT DETECTED NOT DETECTED   Streptococcus agalactiae NOT DETECTED NOT DETECTED   Streptococcus pneumoniae NOT DETECTED NOT DETECTED   Streptococcus pyogenes NOT DETECTED NOT DETECTED   Acinetobacter baumannii NOT DETECTED NOT DETECTED   Enterobacteriaceae species NOT DETECTED NOT DETECTED   Enterobacter cloacae complex NOT DETECTED NOT DETECTED   Escherichia coli NOT DETECTED NOT DETECTED   Klebsiella oxytoca NOT DETECTED NOT DETECTED   Klebsiella pneumoniae NOT DETECTED NOT DETECTED   Proteus species NOT DETECTED NOT DETECTED   Serratia marcescens NOT DETECTED NOT DETECTED   Haemophilus influenzae NOT DETECTED NOT DETECTED   Neisseria meningitidis NOT DETECTED NOT DETECTED   Pseudomonas aeruginosa NOT DETECTED NOT DETECTED   Candida albicans NOT DETECTED NOT DETECTED   Candida glabrata NOT DETECTED NOT DETECTED   Candida krusei NOT DETECTED NOT DETECTED   Candida parapsilosis NOT DETECTED NOT DETECTED   Candida tropicalis NOT DETECTED NOT DETECTED    Comment: Performed at [facility], [address], [place], [place] [address]  SARS CORONAVIRUS 2 (TAT 6-24 HRS) Nasopharyngeal Nasopharyngeal Swab     Status: None   Collection Time: Sep 07, 2019 10:11 PM   Specimen: Nasopharyngeal Swab  Result Value Ref Range   SARS Coronavirus 2 NEGATIVE NEGATIVE    Comment: (NOTE) SARS-CoV-2 target nucleic acids are NOT DETECTED. The SARS-CoV-2 RNA is generally detectable in upper and lower respiratory specimens during the acute phase of infection. Negative results do not preclude SARS-CoV-2 infection, do not rule  out co-infections with other pathogens, and should not be used as the sole basis for treatment or other patient management decisions. Negative results must be combined with clinical observations, patient history, and epidemiological information. The expected result is Negative. Fact Sheet for Patients: HairSlick.no Fact Sheet for Healthcare Providers: quierodirigir.com This test is not yet approved or cleared by the [place] FDA and  has been authorized for detection and/or diagnosis of SARS-CoV-2 by FDA under an Emergency Use Authorization (EUA). This EUA will remain  in effect (meaning this test can be used) for the duration of the COVID-19 declaration under Section 56 4(b)(1) of the Act, 21 U.S.C. section 360bbb-3(b)(1), unless the authorization is terminated or revoked sooner. Performed at [facility], [address] [place], [place] [address]   Urinalysis, Routine w reflex microscopic     Status: Abnormal   Collection Time: September 07, 2019 10:14 PM  Result Value Ref Range   Color, Urine AMBER (A) YELLOW    Comment: BIOCHEMICALS MAY BE AFFECTED BY COLOR   APPearance CLOUDY (A) CLEAR   Specific Gravity, Urine 1.019 1.005 - 1.030   pH 5.0 5.0 - 8.0   Glucose, UA NEGATIVE NEGATIVE mg/dL   Hgb urine dipstick MODERATE (A) NEGATIVE   Bilirubin Urine NEGATIVE NEGATIVE   Ketones, ur NEGATIVE NEGATIVE mg/dL   Protein, ur 462 (A) NEGATIVE mg/dL   Nitrite NEGATIVE NEGATIVE   Leukocytes,Ua MODERATE (A) NEGATIVE   RBC / HPF 11-20  0 - 5 RBC/hpf   WBC, UA >50 (H) 0 - 5 WBC/hpf   Bacteria, UA NONE SEEN NONE SEEN   Squamous Epithelial / LPF 0-5 0 - 5   Mucus PRESENT    Hyaline Casts, UA PRESENT     Comment: Performed at [facility], [address], [place], [place] [address]  Lactic acid, plasma     Status: Abnormal   Collection Time: 09/01/2019 11:00 PM  Result Value Ref Range   Lactic Acid, Venous 2.6 (HH) 0.5 - 1.9  mmol/L    Comment: CRITICAL VALUE NOTED.  VALUE IS CONSISTENT WITH PREVIOUSLY REPORTED AND CALLED VALUE. Performed at [facility], [address], [place], [place] [address]   Troponin I (High Sensitivity)     Status: Abnormal   Collection Time: 08/10/2019 11:00 PM  Result Value Ref Range   Troponin I (High Sensitivity) 87 (H) <18 ng/L    Comment: RESULT CALLED TO, READ BACK BY AND VERIFIED WITH: Canary Brim 08/30/19 0017 WAYK Performed at [facility], [address] [place], [place] [address]   D-dimer, quantitative     Status: Abnormal   Collection Time: 08/30/19  2:49 AM  Result Value Ref Range   D-Dimer, Quant 9.44 (H) 0.00 - 0.50 ug/mL-FEU    Comment: (NOTE) At the manufacturer cut-off of 0.50 ug/mL FEU, this assay has been documented to exclude PE with a sensitivity and negative predictive value of 97 to 99%.  At this time, this assay has not been approved by the FDA to exclude DVT/VTE. Results should be correlated with clinical presentation. Performed at [facility], [address] [place], [place] [address]   Procalcitonin     Status: None   Collection Time: 08/30/19  2:49 AM  Result Value Ref Range   Procalcitonin 7.65 ng/mL    Comment:        Interpretation: PCT > 2 ng/mL: Systemic infection (sepsis) is likely, unless other causes are known. (NOTE)       Sepsis PCT Algorithm           Lower Respiratory Tract                                      Infection PCT Algorithm    ----------------------------     ----------------------------         PCT < 0.25 ng/mL                PCT < 0.10 ng/mL         Strongly encourage             Strongly discourage   discontinuation of antibiotics    initiation of antibiotics    ----------------------------     -----------------------------       PCT 0.25 - 0.50 ng/mL            PCT 0.10 - 0.25 ng/mL               OR       >80% decrease in PCT            Discourage initiation of  antibiotics      Encourage discontinuation           of antibiotics    ----------------------------     -----------------------------         PCT >= 0.50 ng/mL              PCT 0.26 - 0.50 ng/mL               AND       <80% decrease in PCT              Encourage initiation of                                             antibiotics       Encourage continuation           of antibiotics    ----------------------------     -----------------------------        PCT >= 0.50 ng/mL                  PCT > 0.50 ng/mL               AND         increase in PCT                  Strongly encourage                                      initiation of antibiotics    Strongly encourage escalation           of antibiotics                                     -----------------------------                                           PCT <= 0.25 ng/mL                                                 OR                                        > 80% decrease in PCT                                     Discontinue / Do not initiate                                             antibiotics Performed at [facility], [address] [place], [place] [address]   Lactate dehydrogenase     Status: Abnormal   Collection Time: 08/30/19  2:49 AM  Result Value Ref Range   LDH 282 (H) 98 - 192 U/L    Comment: Performed at [facility], [address] [place], [place] [address]  Ferritin     Status: Abnormal   Collection Time: 08/30/19  2:49 AM  Result Value Ref Range   Ferritin 642 (H) 24 - 336 ng/mL    Comment: Performed at [facility], [address], [place], [place] [address]  Triglycerides     Status: Abnormal   Collection Time: 08/30/19  2:49 AM  Result Value Ref Range   Triglycerides 163 (H) <150 mg/dL    Comment: Performed at [facility], [address], [place], [place] [address]  Fibrinogen     Status: Abnormal   Collection Time: 08/30/19  2:49 AM  Result Value Ref Range    Fibrinogen 711 (H) 210 - 475 mg/dL    Comment: Performed at [facility], [address] [place], [place] [address]  C-reactive protein     Status: Abnormal   Collection Time: 08/30/19  2:49 AM  Result Value Ref Range   CRP 22.2 (H) <1.0 mg/dL    Comment: Performed at [facility], [address] [place], [place] [address]  HIV Antibody (routine testing w rflx)     Status: None   Collection Time: 08/30/19  2:49 AM  Result Value Ref Range   HIV Screen 4th Generation wRfx NON REACTIVE NON REACTIVE    Comment: Performed at [facility], [address] [place], [place] [address]  CBC     Status: Abnormal   Collection Time: 08/30/19  2:49 AM  Result Value Ref Range   WBC 22.4 (H) 4.0 - 10.5 K/uL   RBC 4.77 4.22 - 5.81 MIL/uL   Hemoglobin 15.1 13.0 - 17.0 g/dL   HCT 57.8 46.9 - 62.9 %   MCV 89.9 80.0 - 100.0 fL   MCH 31.7 26.0 - 34.0 pg   MCHC 35.2 30.0 - 36.0 g/dL   RDW 52.8 41.3 - 24.4 %   Platelets 247 150 - 400 K/uL   nRBC 0.0 0.0 - 0.2 %    Comment: Performed at [facility], [address], [place], [place] [address]  Troponin I (High Sensitivity)     Status: Abnormal   Collection Time: 08/30/19  2:49 AM  Result Value Ref Range   Troponin I (High Sensitivity) 79 (H) <18 ng/L    Comment: (NOTE) Elevated high sensitivity troponin I (hsTnI) values and significant  changes across serial measurements may suggest ACS but many other  chronic and acute conditions are known to elevate hsTnI results.  Refer to the "Links" section for chest pain algorithms and additional  guidance. Performed at [facility], [address] [place], [place] [address]   Hepatic function panel     Status: Abnormal   Collection Time: 08/30/19  2:49 AM  Result Value Ref Range   Total Protein 7.2 6.5 - 8.1 g/dL   Albumin 2.2 (L) 3.5 - 5.0 g/dL   AST 68 (H) 15 - 41 U/L   ALT 57 (H) 0 - 44 U/L   Alkaline Phosphatase 111 38 - 126 U/L   Total Bilirubin 4.8 (H) 0.3 - 1.2  mg/dL   Bilirubin, Direct 2.7 (H) 0.0 - 0.2 mg/dL   Indirect Bilirubin 2.1 (H) 0.3 - 0.9 mg/dL    Comment: Performed at [facility], [address] [place], [place] [address]  Basic metabolic panel     Status: Abnormal   Collection Time: 08/30/19  2:49 AM  Result Value Ref Range   Sodium 130 (L) 135 - 145 mmol/L   Potassium 5.1 3.5 - 5.1 mmol/L   Chloride 95 (L) 98 - 111 mmol/L   CO2 20 (L) 22 - 32 mmol/L   Glucose, Bld 102 (H) 70 - 99 mg/dL   BUN 35 (H) 6 - 20 mg/dL   Creatinine, Ser 6.04 (H) 0.61 - 1.24 mg/dL   Calcium 8.5 (L) 8.9 - 10.3 mg/dL   GFR calc non Af Amer 42 (L) >60 mL/min   GFR calc Af Amer 49 (L) >60 mL/min   Anion gap 15 5 - 15    Comment: Performed at [facility], [address], [place], [place] [address]  Lactic acid, plasma     Status: Abnormal   Collection Time: 08/30/19  2:49 AM  Result Value Ref Range   Lactic Acid, Venous 3.2 (HH) 0.5 - 1.9 mmol/L    Comment: CRITICAL VALUE NOTED.  VALUE IS CONSISTENT WITH PREVIOUSLY REPORTED AND CALLED VALUE. Performed at [facility], [address], [place], [place] [address]   Troponin I (High Sensitivity)     Status: Abnormal   Collection Time: 08/30/19  4:27 AM  Result Value Ref Range   Troponin I (High Sensitivity) 101 (HH) <18 ng/L    Comment: CRITICAL RESULT CALLED TO, READ BACK BY AND VERIFIED WITH: FLORES M,RN 08/30/19 0539 WAYK Performed at [facility], [address], [place], [place] [address]   POC CBG, ED     Status: None   Collection Time: 08/30/19  5:29 AM  Result Value Ref Range   Glucose-Capillary 93 70 - 99 mg/dL  Culture, respiratory     Status: None (Preliminary result)   Collection Time: 08/30/19  5:39 AM   Specimen: SPU  Result Value Ref Range   Specimen Description SPUTUM    Special Requests NONE    Gram Stain      NO WBC SEEN NO ORGANISMS SEEN Performed at [facility], [address], [place], [place] [address]    Culture PENDING    Report Status  PENDING   I-STAT 7, (LYTES, BLD GAS, ICA, H+H)     Status: Abnormal   Collection Time: 08/30/19  5:46 AM  Result Value Ref Range   pH, Arterial 7.444 7.350 - 7.450   pCO2 arterial 29.8 (L) 32.0 - 48.0 mmHg   pO2, Arterial 97.0 83.0 - 108.0 mmHg   Bicarbonate 20.3 20.0 - 28.0 mmol/L   TCO2 21 (L) 22 - 32 mmol/L   O2 Saturation 98.0 %   Acid-base deficit 2.0 0.0 - 2.0 mmol/L   Sodium 130 (L) 135 - 145 mmol/L   Potassium 4.2 3.5 - 5.1 mmol/L   Calcium, Ion 1.14 (L) 1.15 - 1.40 mmol/L   HCT 39.0 39.0 - 52.0 %   Hemoglobin 13.3 13.0 - 17.0 g/dL   Patient temperature 86.5 F    Collection site RADIAL, ALLEN'S TEST ACCEPTABLE    Drawn by RT    Sample type ARTERIAL   ABO/Rh     Status: None   Collection Time: 08/30/19  6:28 AM  Result Value Ref Range   ABO/RH(D)      B POS Performed at [facility], [address] [place], [place] [address]   Lactic acid, plasma  Status: Abnormal   Collection Time: 08/30/19  7:27 AM  Result Value Ref Range   Lactic Acid, Venous 2.8 (HH) 0.5 - 1.9 mmol/L    Comment: CRITICAL VALUE NOTED.  VALUE IS CONSISTENT WITH PREVIOUSLY REPORTED AND CALLED VALUE. Performed at [facility], [address], [place], [place] [address]   Respiratory Panel by PCR     Status: Abnormal   Collection Time: 08/30/19  7:27 AM   Specimen: Nasopharyngeal Swab; Respiratory  Result Value Ref Range   Adenovirus NOT DETECTED NOT DETECTED   Coronavirus 229E NOT DETECTED NOT DETECTED    Comment: (NOTE) The Coronavirus on the Respiratory Panel, DOES NOT test for the novel  Coronavirus (2019 nCoV)    Coronavirus HKU1 NOT DETECTED NOT DETECTED   Coronavirus NL63 NOT DETECTED NOT DETECTED   Coronavirus OC43 NOT DETECTED NOT DETECTED   Metapneumovirus NOT DETECTED NOT DETECTED   Rhinovirus / Enterovirus DETECTED (A) NOT DETECTED   Influenza A NOT DETECTED NOT DETECTED   Influenza B NOT DETECTED NOT DETECTED   Parainfluenza Virus 1 NOT DETECTED NOT DETECTED    Parainfluenza Virus 2 NOT DETECTED NOT DETECTED   Parainfluenza Virus 3 NOT DETECTED NOT DETECTED   Parainfluenza Virus 4 NOT DETECTED NOT DETECTED   Respiratory Syncytial Virus NOT DETECTED NOT DETECTED   Bordetella pertussis NOT DETECTED NOT DETECTED   Chlamydophila pneumoniae NOT DETECTED NOT DETECTED   Mycoplasma pneumoniae NOT DETECTED NOT DETECTED    Comment: Performed at [facility], [address], [place], [place] [address]  Osmolality     Status: None   Collection Time: 08/30/19  7:27 AM  Result Value Ref Range   Osmolality 287 275 - 295 mOsm/kg    Comment: Performed at [facility], [address] [place], [place] [address]  Troponin I (High Sensitivity)     Status: Abnormal   Collection Time: 08/30/19  7:27 AM  Result Value Ref Range   Troponin I (High Sensitivity) 71 (H) <18 ng/L    Comment: (NOTE) Elevated high sensitivity troponin I (hsTnI) values and significant  changes across serial measurements may suggest ACS but many other  chronic and acute conditions are known to elevate hsTnI results.  Refer to the Links section for chest pain algorithms and additional  guidance. Performed at [facility], [address] [place], [place] [address]   Lactic acid, plasma     Status: Abnormal   Collection Time: 08/30/19 10:00 AM  Result Value Ref Range   Lactic Acid, Venous 3.3 (HH) 0.5 - 1.9 mmol/L    Comment: CRITICAL VALUE NOTED.  VALUE IS CONSISTENT WITH PREVIOUSLY REPORTED AND CALLED VALUE. Performed at [facility], [address], [place], [place] [address]   Glucose, capillary     Status: Abnormal   Collection Time: 08/30/19 12:30 PM  Result Value Ref Range   Glucose-Capillary 171 (H) 70 - 99 mg/dL    MICRO: 13/08 blood cx MRSA 10/22 sputum cx pending IMAGING: Ct Abdomen Pelvis Wo Contrast  Result Date: 08/28/2019 CLINICAL DATA:  Abdominal pain and shortness of breath EXAM: CT ABDOMEN AND PELVIS WITHOUT CONTRAST TECHNIQUE:  Multidetector CT imaging of the abdomen and pelvis was performed following the standard protocol without IV contrast. COMPARISON:  None. FINDINGS: Cardiovascular: There is a small pericardial effusion. Coronary artery calcifications are seen. Scattered mild atherosclerosis is noted. 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 Mediastinum/Nodes: Limited due to the lack of intravenous contrast,  however there does appear to be scattered subcarinal and prevascular lymph nodes seen. The thyroid gland, trachea and esophagus demonstrate no significant findings. Lungs/Pleura: There is multifocal extensive patchy airspace opacities seen throughout both lungs. There is also mild increased interstitial thickening at the lung bases. A small to moderate right and trace left pleural effusion is seen. Upper abdomen: The visualized portion of the upper abdomen is unremarkable. Musculoskeletal/Chest wall: There is no chest wall mass or suspicious osseous finding. No acute osseous abnormality Lower chest: The visualized heart size within normal limits. No pericardial fluid/thickening. No hiatal hernia. The visualized portions of the lungs are clear. Hepatobiliary: Although limited due to the lack of intravenous contrast, normal in appearance without gross focal abnormality. No evidence of calcified gallstones or biliary ductal dilatation. Pancreas:  Unremarkable.  No surrounding inflammatory changes. Spleen: Normal in size. Although limited due to the lack of intravenous contrast, normal in appearance. Adrenals/Urinary Tract: Both adrenal glands appear normal. The kidneys and collecting system appear normal without evidence of urinary tract calculus or hydronephrosis. Bladder is unremarkable. Stomach/Bowel: The stomach, small bowel, and colon are normal in appearance. No inflammatory changes or obstructive findings. appendix is normal. Vascular/Lymphatic: There are no enlarged abdominal or pelvic lymph nodes.  Scattered aortic atherosclerotic calcifications are seen without aneurysmal dilatation. Reproductive: The prostate is unremarkable. Other: No evidence of abdominal wall mass or hernia. Musculoskeletal: No acute or significant osseous findings. IMPRESSION: Multifocal patchy airspace opacities seen throughout both lungs. There are a spectrum of findings in the lungs which can be seen with acute atypical infection, as well as multifocal metastatic disease (no definite primary malignancy identified). Small to moderate right and trace left pleural effusion. Mediastinal adenopathy, likely reactive No acute intra-abdominal or pelvic pathology. Aortic Atherosclerosis (ICD10-I70.0). Electronically Signed   By: Prudencio Pair M.D.   On: 08/12/2019 22:46   Ct Chest Wo Contrast  Result Date: 08/19/2019 CLINICAL DATA:  Abdominal pain and shortness of breath EXAM: CT ABDOMEN AND PELVIS WITHOUT CONTRAST TECHNIQUE: Multidetector CT imaging of the abdomen and pelvis was performed following the standard protocol without IV contrast. COMPARISON:  None. FINDINGS: Cardiovascular: There is a small pericardial effusion. Coronary artery calcifications are seen. Scattered mild atherosclerosis is noted. 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 Mediastinum/Nodes: Limited due to the lack of intravenous contrast, however there does appear to be scattered subcarinal and prevascular lymph nodes seen. The thyroid gland, trachea and esophagus demonstrate no significant findings. Lungs/Pleura: There is multifocal extensive patchy airspace opacities seen throughout both lungs. There is also mild increased interstitial thickening at the lung bases. A small to moderate right and trace left pleural effusion is seen. Upper abdomen: The visualized portion of the upper abdomen is unremarkable. Musculoskeletal/Chest wall: There is no chest wall mass or suspicious osseous finding. No acute osseous abnormality Lower chest: The  visualized heart size within normal limits. No pericardial fluid/thickening. No hiatal hernia. The visualized portions of the lungs are clear. Hepatobiliary: Although limited due to the lack of intravenous contrast, normal in appearance without gross focal abnormality. No evidence of calcified gallstones or biliary ductal dilatation. Pancreas:  Unremarkable.  No surrounding inflammatory changes. Spleen: Normal in size. Although limited due to the lack of intravenous contrast, normal in appearance. Adrenals/Urinary Tract: Both adrenal glands appear normal. The kidneys and collecting system appear normal without evidence of urinary tract calculus or hydronephrosis. Bladder is unremarkable. Stomach/Bowel: The stomach, small bowel, and colon are normal in appearance. No inflammatory changes or obstructive findings.  appendix is normal. Vascular/Lymphatic: There are no enlarged abdominal or pelvic lymph nodes. Scattered aortic atherosclerotic calcifications are seen without aneurysmal dilatation. Reproductive: The prostate is unremarkable. Other: No evidence of abdominal wall mass or hernia. Musculoskeletal: No acute or significant osseous findings. IMPRESSION: Multifocal patchy airspace opacities seen throughout both lungs. There are a spectrum of findings in the lungs which can be seen with acute atypical infection, as well as multifocal metastatic disease (no definite primary malignancy identified). Small to moderate right and trace left pleural effusion. Mediastinal adenopathy, likely reactive No acute intra-abdominal or pelvic pathology. Aortic Atherosclerosis (ICD10-I70.0). Electronically Signed   By: Jonna ClarkBindu  Avutu M.D.   On: 09/01/2019 22:46   Ct Angio Chest Pe W Or Wo Contrast  Result Date: 08/30/2019 CLINICAL DATA:  Intermediate probability for pulmonary embolism. Positive D-dimer EXAM: CT ANGIOGRAPHY CHEST WITH CONTRAST TECHNIQUE: Multidetector CT imaging of the chest was performed using the standard  protocol during bolus administration of intravenous contrast. Multiplanar CT image reconstructions and MIPs were obtained to evaluate the vascular anatomy. CONTRAST:  75mL OMNIPAQUE IOHEXOL 350 MG/ML SOLN COMPARISON:  Noncontrast CT from yesterday FINDINGS: Cardiovascular: Normal heart size. Small pericardial effusion. There is likely smooth serosal enhancement, certainty limited by contrast timing. Patient has history of infective endocarditis per the chart. Negative aorta. No pulmonary embolism is seen. Coronary atherosclerosis that is notable for age. Mediastinum/Nodes: Negative for adenopathy or collection. Lungs/Pleura: Irregularly marginated rounded nodules and consolidative opacities in the bilateral lungs with areas of central cavitation that are non drainable. Bilateral small pleural effusion with scalloping best seen on the right, progressed. No pneumothorax. Upper Abdomen: Negative Musculoskeletal: No evidence of spinal infection. Review of the MIP images confirms the above findings. IMPRESSION: 1. Multifocal cavitary pneumonia consistent with septic emboli. No emboli large enough for visualization by CTA. 2. Small pleural effusions that are complex and increased from yesterday, right larger than left. 3. Small pericardial effusion with possible pericarditis. Electronically Signed   By: Marnee SpringJonathon  Watts M.D.   On: 08/30/2019 06:38   Dg Chest Port 1 View  Result Date: 08/21/2019 CLINICAL DATA:  Right-sided chest pain and shortness of breath for 3 days. Diaphoresis. EXAM: PORTABLE CHEST 1 VIEW COMPARISON:  None. FINDINGS: Heart size is within normal limits allowing for low lung volumes. Bibasilar atelectasis noted. Multifocal ill-defined nodular opacities are seen in both lungs which could be infectious, inflammatory, or neoplastic in etiology. IMPRESSION: Low lung volumes with ill-defined bilateral nodular opacities, which could be infectious, inflammatory or neoplastic in etiology. Recommend further  evaluation with two-view chest at full inspiration, or chest CT. Electronically Signed   By: Danae OrleansJohn A Stahl M.D.   On: 08/23/2019 19:39   [place] Abdomen Limited Ruq  Result Date: 08/30/2019 CLINICAL DATA:  Elevated LFTs EXAM: ULTRASOUND ABDOMEN LIMITED RIGHT UPPER QUADRANT COMPARISON:  None. FINDINGS: Gallbladder: No gallstones or wall thickening visualized. No sonographic Murphy sign noted by sonographer. Common bile duct: Diameter: 3.1 mm. Liver: No focal lesion identified. Within normal limits in parenchymal echogenicity. Portal vein is patent on color Doppler imaging with normal direction of blood flow towards the liver. Other: Small right pleural effusion is noted similar to that seen on prior CT examination. IMPRESSION: Unremarkable right upper quadrant ultrasound. Right pleural effusion similar to that seen on prior exam. Electronically Signed   By: Alcide CleverMark  Lukens M.D.   On: 08/30/2019 08:55    Assessment/Plan:  48yo M with fever, chest pain, shortness of breath with hx of iv drug use, found to have  pericarditis, presumed septic pulmonary emboli in setting of mrsa bacteremia. Concern for endocarditis  - continue with vancomycin, can d/c cefepime - repeat blood cx on 10/24 - would start with TTE, also to define pericardial effusion, will need ongoing tele evaluation for pericarditis  Given ivdu = would check hep c ab  sars cov2 testing = he is negative x 2 no need for isolation  Opiate dependence = would watch for withdrawal symptoms

## 2019-08-30 NOTE — ED Notes (Signed)
Breakfast Ordered 

## 2019-08-30 NOTE — ED Notes (Signed)
Admitting at bedside 

## 2019-08-30 NOTE — Progress Notes (Signed)
Pt uncomfortable on VM. Pt switched to 13L [place] at this time. RT will continue to monitor.

## 2019-08-30 NOTE — Consult Note (Addendum)
Cardiology Consultation:   Patient ID: Scott Strong MRN: [id number]; DOB: 01-26-1971  Admit date: 08/16/2019 Date of Consult: 08/30/2019  Primary Care Provider: Patient, No Pcp Per Primary Cardiologist: Ena Dawley, MD new Primary Electrophysiologist:  None   Patient Profile:   Scott Strong is a 48 y.o. male with a hx of IV drug use (heroin daily) who is being seen today for the evaluation of CP and elevated troponin at the request of Dr. Karleen Hampshire.  History of Present Illness:   [name] has no prior medical history, no cardiac history. He presented to [facility] with CP and SOB x 3 days. He began coughing up blood today prompting evaluation in ER. CP described as pleuritic and worse with lying down and leaning forward. He also reported fevers and generalized body aches. He uses IV heroin daily. On presentation, he was afebrile but tachycardic and tachypnea with hypotension. Pressure improved with IVF. Lactic acid 2.8 --> 3.3. Lipase 173, AST 65, ALT 66, alk phos 128 and T bili 4.8.   EKG concerning for TWI in V1, ST elevation lead I, II, AVF, V3-6 HS troponin drawn: 17 --> 87 --> 79 --> 101 --> 71 Repeat EKG resolution of TWI in V1 and persistent ST elevation in V2-6  Case reviewed with Dr. Irish Lack who concluded this is more consistent with a viral pericarditis instead of ACS. No angiography will be pursued for now.  CRP 22 Fibrinogen 711 LDH 282 --> 227 D-dimer 9.44 Blood Cx positive for staph  Rhinovirus/enterovirus positive AKI with sCr 2.70 --> 1.85  CTA with multifocal cavitary PNA consistent with septic emboli, small pericardial effusion and possible pericarditis    Heart Pathway Score:     History reviewed. No pertinent past medical history.  History reviewed. No pertinent surgical history.   Home Medications:  Prior to Admission medications   Not on File    Inpatient Medications: Scheduled Meds:  colchicine  0.3 mg Oral Daily   predniSONE  40 mg Oral Q  breakfast   Continuous Infusions:  sodium chloride 150 mL/hr at 08/30/19 0731   sodium chloride     [START ON 08/31/2019] vancomycin     PRN Meds: acetaminophen **OR** acetaminophen, morphine injection  Allergies:   No Known Allergies  Social History:   Social History   Socioeconomic History   Marital status: Significant Other    Spouse name: Not on file   Number of children: Not on file   Years of education: Not on file   Highest education level: Not on file  Occupational History   Not on file  Social Needs   Financial resource strain: Not on file   Food insecurity    Worry: Not on file    Inability: Not on file   Transportation needs    Medical: Not on file    Non-medical: Not on file  Tobacco Use   Smoking status: Never Smoker   Smokeless tobacco: Never Used  Substance and Sexual Activity   Alcohol use: Not Currently   Drug use: Yes    Types: IV, Heroin   Sexual activity: Not on file  Lifestyle   Physical activity    Days per week: Not on file    Minutes per session: Not on file   Stress: Not on file  Relationships   Social connections    Talks on phone: Not on file    Gets together: Not on file    Attends religious service: Not on file    Active  member of club or organization: Not on file    Attends meetings of clubs or organizations: Not on file    Relationship status: Not on file   Intimate partner violence    Fear of current or ex partner: Not on file    Emotionally abused: Not on file    Physically abused: Not on file    Forced sexual activity: Not on file  Other Topics Concern   Not on file  Social History Narrative   Not on file    Family History:   Family history was unable to be obtained given the patient was sedated on precedex and subsequently intubated.   ROS:  Please see the history of present illness.   All other ROS reviewed and negative.     Physical Exam/Data:   Vitals:   08/30/19 1205 08/30/19 1210  08/30/19 1215 08/30/19 1300  BP:  97/70 105/66 105/75  Pulse: (!) 31  (!) 110   Resp: (!) 21 (!) 37 (!) 29 (!) 34  Temp:      TempSrc:      SpO2: (!) 88%  95% 95%  Weight:      Height:        Intake/Output Summary (Last 24 hours) at 08/30/2019 1336 Last data filed at 08/30/2019 1005 Gross per 24 hour  Intake 4296.51 ml  Output 350 ml  Net 3946.51 ml   Last 3 Weights 08/21/2019  Weight (lbs) 180 lb  Weight (kg) 81.647 kg     Body mass index is 27.37 kg/m.  General:  Ill-appearing Respirations: pleuritic pain with deep inspiration Neuro:  CNs 2-12 intact, no focal abnormalities noted Psych:  Normal affect   EKG:  The EKG was personally reviewed and demonstrates: initial EKG sinus tachycardia with HR 131, TWI in V1, ST elevation lead I, II, AVF, V3-6 Repeat EKG with resolution of TWI in V1 and persistent ST elevation in V2-6, continues to be sinus tachycardia with HR 113 Telemetry:  Telemetry was personally reviewed and demonstrates:  Sinus tachycardia in the 110s  Relevant CV Studies:    Laboratory Data:  High Sensitivity Troponin:   Recent Labs  Lab 09/08/2019 1838 08/26/2019 2300 08/30/19 0249 08/30/19 0427 08/30/19 0727  TROPONINIHS 17 87* 79* 101* 71*     Chemistry Recent Labs  Lab 08/16/2019 1838 08/30/19 0249 08/30/19 0546  NA 128* 130* 130*  K 4.4 5.1 4.2  CL 90* 95*  --   CO2 22 20*  --   GLUCOSE 176* 102*  --   BUN 41* 35*  --   CREATININE 2.70* 1.85*  --   CALCIUM 8.8* 8.5*  --   GFRNONAA 27* 42*  --   GFRAA 31* 49*  --   ANIONGAP 16* 15  --     Recent Labs  Lab 08/17/2019 1838 08/30/19 0249  PROT 7.9 7.2  ALBUMIN 2.4* 2.2*  AST 65* 68*  ALT 66* 57*  ALKPHOS 128* 111  BILITOT 4.8* 4.8*   Hematology Recent Labs  Lab 09/01/2019 1838 08/30/19 0249 08/30/19 0546  WBC 23.8* 22.4*  --   RBC 4.84 4.77  --   HGB 15.1 15.1 13.3  HCT 43.8 42.9 39.0  MCV 90.5 89.9  --   MCH 31.2 31.7  --   MCHC 34.5 35.2  --   RDW 13.3 13.4  --   PLT 281  247  --    BNPNo results for input(s): BNP, PROBNP in the last 168 hours.  DDimer  Recent Labs  Lab 08/30/19 0249  DDIMER 9.44*     Radiology/Studies:  Ct Abdomen Pelvis Wo Contrast  Result Date: 09/06/2019 CLINICAL DATA:  Abdominal pain and shortness of breath EXAM: CT ABDOMEN AND PELVIS WITHOUT CONTRAST TECHNIQUE: Multidetector CT imaging of the abdomen and pelvis was performed following the standard protocol without IV contrast. COMPARISON:  None. FINDINGS: Cardiovascular: There is a small pericardial effusion. Coronary artery calcifications are seen. Scattered mild atherosclerosis is noted. 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 Mediastinum/Nodes: Limited due to the lack of intravenous contrast, however there does appear to be scattered subcarinal and prevascular lymph nodes seen. The thyroid gland, trachea and esophagus demonstrate no significant findings. Lungs/Pleura: There is multifocal extensive patchy airspace opacities seen throughout both lungs. There is also mild increased interstitial thickening at the lung bases. A small to moderate right and trace left pleural effusion is seen. Upper abdomen: The visualized portion of the upper abdomen is unremarkable. Musculoskeletal/Chest wall: There is no chest wall mass or suspicious osseous finding. No acute osseous abnormality Lower chest: The visualized heart size within normal limits. No pericardial fluid/thickening. No hiatal hernia. The visualized portions of the lungs are clear. Hepatobiliary: Although limited due to the lack of intravenous contrast, normal in appearance without gross focal abnormality. No evidence of calcified gallstones or biliary ductal dilatation. Pancreas:  Unremarkable.  No surrounding inflammatory changes. Spleen: Normal in size. Although limited due to the lack of intravenous contrast, normal in appearance. Adrenals/Urinary Tract: Both adrenal glands appear normal. The kidneys and collecting  system appear normal without evidence of urinary tract calculus or hydronephrosis. Bladder is unremarkable. Stomach/Bowel: The stomach, small bowel, and colon are normal in appearance. No inflammatory changes or obstructive findings. appendix is normal. Vascular/Lymphatic: There are no enlarged abdominal or pelvic lymph nodes. Scattered aortic atherosclerotic calcifications are seen without aneurysmal dilatation. Reproductive: The prostate is unremarkable. Other: No evidence of abdominal wall mass or hernia. Musculoskeletal: No acute or significant osseous findings. IMPRESSION: Multifocal patchy airspace opacities seen throughout both lungs. There are a spectrum of findings in the lungs which can be seen with acute atypical infection, as well as multifocal metastatic disease (no definite primary malignancy identified). Small to moderate right and trace left pleural effusion. Mediastinal adenopathy, likely reactive No acute intra-abdominal or pelvic pathology. Aortic Atherosclerosis (ICD10-I70.0). Electronically Signed   By: Prudencio Pair M.D.   On: 09/01/2019 22:46   Ct Chest Wo Contrast  Result Date: 08/13/2019 CLINICAL DATA:  Abdominal pain and shortness of breath EXAM: CT ABDOMEN AND PELVIS WITHOUT CONTRAST TECHNIQUE: Multidetector CT imaging of the abdomen and pelvis was performed following the standard protocol without IV contrast. COMPARISON:  None. FINDINGS: Cardiovascular: There is a small pericardial effusion. Coronary artery calcifications are seen. Scattered mild atherosclerosis is noted. 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 Mediastinum/Nodes: Limited due to the lack of intravenous contrast, however there does appear to be scattered subcarinal and prevascular lymph nodes seen. The thyroid gland, trachea and esophagus demonstrate no significant findings. Lungs/Pleura: There is multifocal extensive patchy airspace opacities seen throughout both lungs. There is also mild increased  interstitial thickening at the lung bases. A small to moderate right and trace left pleural effusion is seen. Upper abdomen: The visualized portion of the upper abdomen is unremarkable. Musculoskeletal/Chest wall: There is no chest wall mass or suspicious osseous finding. No acute osseous abnormality Lower chest: The visualized heart size within normal limits. No pericardial fluid/thickening. No hiatal hernia. The visualized portions of  the lungs are clear. Hepatobiliary: Although limited due to the lack of intravenous contrast, normal in appearance without gross focal abnormality. No evidence of calcified gallstones or biliary ductal dilatation. Pancreas:  Unremarkable.  No surrounding inflammatory changes. Spleen: Normal in size. Although limited due to the lack of intravenous contrast, normal in appearance. Adrenals/Urinary Tract: Both adrenal glands appear normal. The kidneys and collecting system appear normal without evidence of urinary tract calculus or hydronephrosis. Bladder is unremarkable. Stomach/Bowel: The stomach, small bowel, and colon are normal in appearance. No inflammatory changes or obstructive findings. appendix is normal. Vascular/Lymphatic: There are no enlarged abdominal or pelvic lymph nodes. Scattered aortic atherosclerotic calcifications are seen without aneurysmal dilatation. Reproductive: The prostate is unremarkable. Other: No evidence of abdominal wall mass or hernia. Musculoskeletal: No acute or significant osseous findings. IMPRESSION: Multifocal patchy airspace opacities seen throughout both lungs. There are a spectrum of findings in the lungs which can be seen with acute atypical infection, as well as multifocal metastatic disease (no definite primary malignancy identified). Small to moderate right and trace left pleural effusion. Mediastinal adenopathy, likely reactive No acute intra-abdominal or pelvic pathology. Aortic Atherosclerosis (ICD10-I70.0). Electronically Signed   By:  Prudencio Pair M.D.   On: 08/19/2019 22:46   Ct Angio Chest Pe W Or Wo Contrast  Result Date: 08/30/2019 CLINICAL DATA:  Intermediate probability for pulmonary embolism. Positive D-dimer EXAM: CT ANGIOGRAPHY CHEST WITH CONTRAST TECHNIQUE: Multidetector CT imaging of the chest was performed using the standard protocol during bolus administration of intravenous contrast. Multiplanar CT image reconstructions and MIPs were obtained to evaluate the vascular anatomy. CONTRAST:  12m OMNIPAQUE IOHEXOL 350 MG/ML SOLN COMPARISON:  Noncontrast CT from yesterday FINDINGS: Cardiovascular: Normal heart size. Small pericardial effusion. There is likely smooth serosal enhancement, certainty limited by contrast timing. Patient has history of infective endocarditis per the chart. Negative aorta. No pulmonary embolism is seen. Coronary atherosclerosis that is notable for age. Mediastinum/Nodes: Negative for adenopathy or collection. Lungs/Pleura: Irregularly marginated rounded nodules and consolidative opacities in the bilateral lungs with areas of central cavitation that are non drainable. Bilateral small pleural effusion with scalloping best seen on the right, progressed. No pneumothorax. Upper Abdomen: Negative Musculoskeletal: No evidence of spinal infection. Review of the MIP images confirms the above findings. IMPRESSION: 1. Multifocal cavitary pneumonia consistent with septic emboli. No emboli large enough for visualization by CTA. 2. Small pleural effusions that are complex and increased from yesterday, right larger than left. 3. Small pericardial effusion with possible pericarditis. Electronically Signed   By: JMonte FantasiaM.D.   On: 08/30/2019 06:38   Dg Chest Port 1 View  Result Date: 08/09/2019 CLINICAL DATA:  Right-sided chest pain and shortness of breath for 3 days. Diaphoresis. EXAM: PORTABLE CHEST 1 VIEW COMPARISON:  None. FINDINGS: Heart size is within normal limits allowing for low lung volumes. Bibasilar  atelectasis noted. Multifocal ill-defined nodular opacities are seen in both lungs which could be infectious, inflammatory, or neoplastic in etiology. IMPRESSION: Low lung volumes with ill-defined bilateral nodular opacities, which could be infectious, inflammatory or neoplastic in etiology. Recommend further evaluation with two-view chest at full inspiration, or chest CT. Electronically Signed   By: JMarlaine HindM.D.   On: 08/30/2019 19:39   UKoreaAbdomen Limited Ruq  Result Date: 08/30/2019 CLINICAL DATA:  Elevated LFTs EXAM: ULTRASOUND ABDOMEN LIMITED RIGHT UPPER QUADRANT COMPARISON:  None. FINDINGS: Gallbladder: No gallstones or wall thickening visualized. No sonographic Murphy sign noted by sonographer. Common bile duct: Diameter: 3.1 mm.  Liver: No focal lesion identified. Within normal limits in parenchymal echogenicity. Portal vein is patent on color Doppler imaging with normal direction of blood flow towards the liver. Other: Small right pleural effusion is noted similar to that seen on prior CT examination. IMPRESSION: Unremarkable right upper quadrant ultrasound. Right pleural effusion similar to that seen on prior exam. Electronically Signed   By: Inez Catalina M.D.   On: 08/30/2019 08:55    Assessment and Plan:   1. Elevated troponin 2. Abnormal EKG 3. Pericardial effusion on CT chest - elevated CRP - staph bacteremia - Rhinovirus/enterovirus positive - COVID negative, but given CT chest findings, he is pending second COVID test - serial EKGs with modest improvement in diffuse ST elevation - chest pain pleuritic in nature and consistent with PNA and septic emboli  - clinical picture consistent with pericarditis - obtain echocardiogram for pericardial effusion and to evaluate structure, function, and rule out vegetation - will start colchicine 0.3 mg BID  4. AKI - baseline unknown - sCr trending down to 1.59 from peak of 2.70 - avoid nephrotoxins in the setting of CT contrast  5.IV  drug use - will obtain echocardiogram to visualize pericardial effusion and rule out vegetation on valves  Pt transferring to MICU for septic picture and BiPAP.    For questions or updates, please contact [place] Please consult [email or web address] for contact info under   Signed, Ledora Bottcher, [place]  08/30/2019 1:36 PM   The patient was seen, examined and discussed with Minette Brine , PA-C and I agree with the above.   48 y.o. male with a hx of IV drug use (heroin daily) who is being seen today for the evaluation of CP and elevated troponin 17 --> 87 --> 79 --> 101 --> 71. No prior medical history, no cardiac history. He presented to [facility] with CP and SOB x 3 days. He began coughing up blood today prompting evaluation in ER. CP described as pleuritic and worse with lying down and leaning forward.  He also reported fevers and myalgias.  He is currently lethargic on Precedex in ICU, unable to respond whether he has chest pain or not.  I have reviewed his EKGs that showed diffuse ST elevation that they are mild and concave, his troponin has a very flat trend.  He tested negative for COVID-19 infection.  His CRP severely elevated.  He has elevated LFTs, creatinine 1.59, elevated lactic acid is at 3.7, he is quite hypotensive on physical exam but his pressure improved with IV fluids.  He CTA showed multifocal cavitary pneumonia S consistent with septic emboli.  Echocardiogram showed normal LVEF 60 to 65%, mild circumferential pericardial effusion. At this point no ischemic evaluation is indicated, he has minimally elevated troponin with flat trend consistent with pericarditis, we will add colchicine to his regimen, we will follow.  Ena Dawley, MD 08/30/2019

## 2019-08-30 NOTE — ED Notes (Signed)
Pt hypotensive at 87/62. Dr. Baltazar Najjar paged about giving metoprolol. Dr. Placing orders.

## 2019-08-30 NOTE — Consult Note (Signed)
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  History of present illness   48 year old male patient without significant medical history other than IV drug use.  Presented to the emergency room on 10/21 with chief complaint of about 3-day history of pleuritic type chest discomfort, cough with occasional streaky hemoptysis and progressive shortness of breath.  He also reported intermittent fever and myalgia.  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  In the emergency room he was found to be tachycardic and tachypneic meeting SIRS parameters.  Lactic acid was slightly elevated at 2.8, white blood cell count 50,000, COVID-19 negative.  CT chest was obtained after abnormal chest x-ray evaluated this demonstrated multifocal patchy opacities he was admitted with a working diagnosis of sepsis.  He was administered IV fluids, supplemental oxygen, cultures were obtained and empiric antibiotics started.  Not long after hospital admission, early in the morning on 10/22 Rapid response call was made.  The patient had become progressively hypotensive requiring repeat fluid challenge his temperature spiked to 102.4 and he remained tachycardic.  He had a elevated D-dimer and because of this a CT angiogram was obtained this showed progression of bilateral pleural effusions particularly on the right with the right effusion appearing more complicated in nature, he also had what appeared to be multifocal cavitary areas of consolidation consistent with septic emboli in addition to small pericardial effusion.  Later that morning his blood cultures were found to be positive with initial study  suggesting MRSA.  He continued to decline with worsening work of breathing and because of this critical care was asked to evaluate.  Past Medical History  No significant medical history other than IV drug abuse  Significant Hospital Events   10/21 presented to the emergency room with chief complaint of shortness of breath and chest pain  Consults:  Critical  care consulted 10/22 Infectious disease 10/22 Card 10/22 Procedures:    Significant Diagnostic Tests:   CT chest (angiogram) 10/22: 1. Multifocal cavitary pneumonia consistent with septic emboli. No emboli large enough for visualization by CTA. 2. Small pleural effusions that are complex and increased from yesterday, right larger than left. 3. Small pericardial effusion with possible pericarditis. ECHO 10/22>>> Micro Data:  BCX2 10: GPC>>> RVP 10/22: rhinovirus covid 19 X 2 (on 10/21 and 10/22) both negative Reflexed blood ID panel: +MRSA  Antimicrobials:  vanc 10/21>>> Cefepime 10/21>>>  Interim history/subjective:   Increased WOB and chest pain  Objective   Blood pressure 105/75, pulse (Abnormal) 110, temperature 97.6 F (36.4 C), temperature source Axillary, resp. rate (Abnormal) 34, height 5\' 8"  (1.727 m), weight 81.6 kg, SpO2 95 %.        Intake/Output Summary (Last 24 hours) at 08/30/2019 1353 Last data filed at 08/30/2019 1005 Gross per 24 hour  Intake 4296.51 ml  Output 350 ml  Net 3946.51 ml   Filed Weights   02/26/19 1845  Weight: 81.6 kg    Examination: General: Acutely ill [age] year old male patient currently sitting upright in bed he is unable to reposition without significant discomfort unable to take a deep breath, moaning in pain difficulty talking secondary to pain anytime he takes it  breath HENT: Poor dentition sclera are icteric mucous membranes currently moist no JVD Lungs: Diminished throughout positive accessory use tachypneic Cardiovascular: Rapid rhythm sinus tachycardia no audible  murmur Abdomen: Soft not tender Extremities: Warm and dry Neuro: Awake anxious oriented GU: Voids  Assessment and plan   Severe sepsis 2/2 MRSA bacteremia w/ 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 This could be 2/2 work of breathing Plan Cont IVFs Cont day 2 vanc; cefepime stopped per ID Cont IVFs Repeat LA. Hope to avoid central access if able given bacteremia F/u TTE; if formal read is neg will eventually need TEE  Acute hypoxic respiratory failure w/ bilateral PNA and what appears to be septic pulmonary emboli w/ cavitations as well as R>L pleural effusions. The right appears loculated.  Plan Cont supplemental oxygen Cont pulse ox Treat pain IS [place] right chest. May need chest tube if fluid collection sig   Mild lactic acidosis. Could be worsening sepsis BUT also consider this 2/2 WOB  Plan Serial LAs  Rhinovirus Plan Supportive care  Pain w/ h/o IVDA.  -could be component of withdrawal Plan precedex gtt Low dose PRN fent   Shock liver Plan  Trend LFTs  Best practice:  Diet: NPO Pain/Anxiety/Delirium protocol (if indicated): precedex started 10/22 VAP protocol (if indicated): NA DVT prophylaxis: SCD GI prophylaxis: NA Glucose control: NA Mobility: BR Code Status: full code  Family Communication: pending Disposition:  Critically ill due to endocarditis. Move to ICU. May need right chest tube. Worried he may get worse before he improves.   Labs   CBC: Recent Labs  Lab 08/10/2019 1838 08/30/19 0249 08/30/19 0546  WBC 23.8* 22.4*  --   NEUTROABS 20.2*  --   --   HGB 15.1 15.1 13.3  HCT 43.8 42.9 39.0  MCV 90.5 89.9  --   PLT 281 247  --     Basic Metabolic Panel: Recent Labs  Lab 08/19/2019 1838 08/30/19 0249 08/30/19 0546  NA 128* 130* 130*  K 4.4 5.1 4.2  CL 90* 95*  --   CO2 22 20*  --   GLUCOSE 176* 102*  --   BUN 41* 35*  --   CREATININE 2.70* 1.85*  --   CALCIUM 8.8* 8.5*  --     GFR: Estimated Creatinine Clearance: 47.2 mL/min (A) (by C-G formula based on SCr of 1.85 mg/dL (H)). Recent Labs  Lab 08/12/2019 1838 09/02/2019 2300 08/30/19 0249 08/30/19 0727 08/30/19 1000  PROCALCITON  --   --  7.65  --   --   WBC 23.8*  --  22.4*  --   --   LATICACIDVEN 2.8* 2.6* 3.2* 2.8* 3.3*    Liver Function Tests: Recent Labs  Lab 08/16/2019 1838 08/30/19 0249  AST 65* 68*  ALT 66* 57*  ALKPHOS 128* 111  BILITOT 4.8* 4.8*  PROT 7.9 7.2  ALBUMIN 2.4* 2.2*   Recent Labs  Lab 08/11/2019 1838  LIPASE 173*   No results for input(s): AMMONIA in the last 168 hours.  ABG    Component Value Date/Time   PHART 7.393 08/30/2019 1245   PCO2ART 33.8 08/30/2019 1245   PO2ART 81.3 (L) 08/30/2019 1245   HCO3 20.2 08/30/2019 1245   TCO2 21 (L) 08/30/2019 0546   ACIDBASEDEF 3.9 (H) 08/30/2019 1245   O2SAT 95.8 08/30/2019 1245     Coagulation Profile: Recent Labs  Lab 08/14/2019 1838  INR 1.4*    Cardiac Enzymes: No results  for input(s): CKTOTAL, CKMB, CKMBINDEX, TROPONINI in the last 168 hours.  HbA1C: No results found for: HGBA1C  CBG: Recent Labs  Lab 08/27/2019 1834 08/30/19 0529 08/30/19 1230  GLUCAP 173* 93 171*    Review of Systems:   Not able   Past Medical History  He,  has no past medical history on file.   Surgical History   History reviewed. No pertinent surgical history.   Social History   reports that he has never smoked. He has never used smokeless tobacco. He reports previous alcohol use. He reports current drug use. Drugs: IV and Heroin.   Family History   His family history is not on file.   Allergies No Known Allergies   Home Medications  Prior to Admission medications   Not on File     Critical care time: 36 min     Simonne Martinet ACNP-BC [facility] Pulmonary/Critical Care Pager # [phone] OR # [phone] if no answer

## 2019-08-30 NOTE — H&P (Signed)
History and Physical    Scott Strong QVZ:[id number] DOB: 1971/08/18 DOA: 08/09/2019  PCP: Patient, No Pcp Per Patient coming from: Home  Chief Complaint: Chest pain, shortness of breath  HPI: Scott Strong is a 48 y.o. male with no significant past medical history presenting to the ED with complaints of chest pain and shortness of breath x3 days.  Today he started coughing up blood.  Reports having pleuritic chest pain which is worse when lying down and better when sitting up and leaning forward.  He has been having fevers and generalized body aches.  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  Patient is very ill-appearing and no additional history could be obtained from him.  ED Course: Afebrile.  Tachycardic and tachypneic.  Initially hypotensive, blood pressure now improved after IV fluid resuscitation.  Not hypoxic.  White blood cell count 32.9 with neutrophilic predominance.  Lactic acid 2.8 >2.6.  Blood culture x2 pending.  UA with negative nitrite, moderate amount of leukocytes, greater than 50 WBCs, and no bacteria on microscopic examination.  Urine culture pending.  SARS-CoV-2 test pending.  Corrected sodium 129.  BUN 41, creatinine 2.7.  No prior labs for comparison.  Lipase 173.  AST 65, ALT 66, alk phos 128, and T bili 4.8.  INR 1.4.  High-sensitivity troponin 17 >87.  EKG with diffuse ST elevations.  Case was discussed with cardiology and these findings are thought to be related to acute viral pericarditis.  Due to suspicion for COVID-19 viral infection, inflammatory markers ordered and currently pending.  Chest x-ray with ill-defined bilateral nodular opacities.  Chest CT without contrast showing multifocal patchy airspace opacities throughout both lungs suspicious for atypical infection as well as multifocal metastatic disease although no primary malignancy identified on CT chest/abdomen/pelvis.  Chest CT showing small to moderate right and trace left pleural effusion.  In addition, showing a  small pericardial effusion.  Mediastinal adenopathy, likely reactive.  CT abdomen pelvis showing no acute intra-abdominal or pelvic pathology. Patient received colchicine, fentanyl, morphine, Zofran, ceftriaxone, azithromycin, and 2.5 liter LR boluses.  Review of Systems:  All systems reviewed and apart from history of presenting illness, are negative.  History reviewed. No pertinent past medical history.  History reviewed. No pertinent surgical history.   reports that he has never smoked. He has never used smokeless tobacco. He reports previous alcohol use. He reports current drug use. Drugs: IV and Heroin.  No Known Allergies  History reviewed. No pertinent family history.  Prior to Admission medications   Not on File    Physical Exam: Vitals:   08/30/19 0530 08/30/19 0544 08/30/19 0645 08/30/19 0700  BP: (!) 125/114   (!) 88/63  Pulse: (!) 124  (!) 115   Resp: (!) 27  (!) 43 (!) 40  Temp:  (!) 102.4 F (39.1 C)    TempSrc:  Rectal    SpO2: 94%  98%   Weight:      Height:        Physical Exam  Constitutional: He is oriented to person, place, and time. He appears well-developed and well-nourished. He appears distressed.  HENT:  Head: Normocephalic.  Eyes: Right eye exhibits no discharge. Left eye exhibits no discharge.  Neck: Neck supple.  Cardiovascular: Regular rhythm and intact distal pulses.  Extremely tachycardic with heart rate in the 150s  Pulmonary/Chest: He is in respiratory distress. He has no wheezes.  Tachypneic On 2 L supplemental oxygen Coarse breath sounds bilaterally  Abdominal: Soft. Bowel sounds are  normal. He exhibits no distension. There is abdominal tenderness. There is guarding. There is no rebound.  Generalized tenderness to palpation with guarding  Musculoskeletal:        General: No edema.  Neurological: He is alert and oriented to person, place, and time.  Skin: Skin is warm. No erythema.     Labs on Admission: I have personally reviewed  following labs and imaging studies  CBC: Recent Labs  Lab 08/11/2019 1838 08/30/19 0249 08/30/19 0546  WBC 23.8* 22.4*  --   NEUTROABS 20.2*  --   --   HGB 15.1 15.1 13.3  HCT 43.8 42.9 39.0  MCV 90.5 89.9  --   PLT 281 247  --    Basic Metabolic Panel: Recent Labs  Lab 08/22/2019 1838 08/30/19 0249 08/30/19 0546  NA 128* 130* 130*  K 4.4 5.1 4.2  CL 90* 95*  --   CO2 22 20*  --   GLUCOSE 176* 102*  --   BUN 41* 35*  --   CREATININE 2.70* 1.85*  --   CALCIUM 8.8* 8.5*  --    GFR: Estimated Creatinine Clearance: 47.2 mL/min (A) (by C-G formula based on SCr of 1.85 mg/dL (H)). Liver Function Tests: Recent Labs  Lab 09/03/2019 1838 08/30/19 0249  AST 65* 68*  ALT 66* 57*  ALKPHOS 128* 111  BILITOT 4.8* 4.8*  PROT 7.9 7.2  ALBUMIN 2.4* 2.2*   Recent Labs  Lab 08/21/2019 1838  LIPASE 173*   No results for input(s): AMMONIA in the last 168 hours. Coagulation Profile: Recent Labs  Lab 09/03/2019 1838  INR 1.4*   Cardiac Enzymes: No results for input(s): CKTOTAL, CKMB, CKMBINDEX, TROPONINI in the last 168 hours. BNP (last 3 results) No results for input(s): PROBNP in the last 8760 hours. HbA1C: No results for input(s): HGBA1C in the last 72 hours. CBG: Recent Labs  Lab 08/18/2019 1834  GLUCAP 173*   Lipid Profile: Recent Labs    08/30/19 0249  TRIG 163*   Thyroid Function Tests: No results for input(s): TSH, T4TOTAL, FREET4, T3FREE, THYROIDAB in the last 72 hours. Anemia Panel: Recent Labs    08/30/19 0249  FERRITIN 642*   Urine analysis:    Component Value Date/Time   COLORURINE AMBER (A) 09/07/2019 2214   APPEARANCEUR CLOUDY (A) 09/03/2019 2214   LABSPEC 1.019 09/08/2019 2214   PHURINE 5.0 08/20/2019 2214   GLUCOSEU NEGATIVE 08/18/2019 2214   HGBUR MODERATE (A) 08/15/2019 2214   BILIRUBINUR NEGATIVE 08/20/2019 2214   [place] 08/14/2019 2214   PROTEINUR 100 (A) 08/27/2019 2214   NITRITE NEGATIVE 08/15/2019 2214   LEUKOCYTESUR  MODERATE (A) 08/19/2019 2214    Radiological Exams on Admission: Ct Abdomen Pelvis Wo Contrast  Result Date: 08/28/2019 CLINICAL DATA:  Abdominal pain and shortness of breath EXAM: CT ABDOMEN AND PELVIS WITHOUT CONTRAST TECHNIQUE: Multidetector CT imaging of the abdomen and pelvis was performed following the standard protocol without IV contrast. COMPARISON:  None. FINDINGS: Cardiovascular: There is a small pericardial effusion. Coronary artery calcifications are seen. Scattered mild atherosclerosis is noted. 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 Mediastinum/Nodes: Limited due to the lack of intravenous contrast, however there does appear to be scattered subcarinal and prevascular lymph nodes seen. The thyroid gland, trachea and esophagus demonstrate no significant findings. Lungs/Pleura: There is multifocal extensive patchy airspace opacities seen throughout both lungs. There is also mild increased interstitial thickening at the lung bases. A small to moderate right and trace left pleural  effusion is seen. Upper abdomen: The visualized portion of the upper abdomen is unremarkable. Musculoskeletal/Chest wall: There is no chest wall mass or suspicious osseous finding. No acute osseous abnormality Lower chest: The visualized heart size within normal limits. No pericardial fluid/thickening. No hiatal hernia. The visualized portions of the lungs are clear. Hepatobiliary: Although limited due to the lack of intravenous contrast, normal in appearance without gross focal abnormality. No evidence of calcified gallstones or biliary ductal dilatation. Pancreas:  Unremarkable.  No surrounding inflammatory changes. Spleen: Normal in size. Although limited due to the lack of intravenous contrast, normal in appearance. Adrenals/Urinary Tract: Both adrenal glands appear normal. The kidneys and collecting system appear normal without evidence of urinary tract calculus or hydronephrosis. Bladder is  unremarkable. Stomach/Bowel: The stomach, small bowel, and colon are normal in appearance. No inflammatory changes or obstructive findings. appendix is normal. Vascular/Lymphatic: There are no enlarged abdominal or pelvic lymph nodes. Scattered aortic atherosclerotic calcifications are seen without aneurysmal dilatation. Reproductive: The prostate is unremarkable. Other: No evidence of abdominal wall mass or hernia. Musculoskeletal: No acute or significant osseous findings. IMPRESSION: Multifocal patchy airspace opacities seen throughout both lungs. There are a spectrum of findings in the lungs which can be seen with acute atypical infection, as well as multifocal metastatic disease (no definite primary malignancy identified). Small to moderate right and trace left pleural effusion. Mediastinal adenopathy, likely reactive No acute intra-abdominal or pelvic pathology. Aortic Atherosclerosis (ICD10-I70.0). Electronically Signed   By: Prudencio Pair M.D.   On: 08/26/2019 22:46   Ct Chest Wo Contrast  Result Date: 08/21/2019 CLINICAL DATA:  Abdominal pain and shortness of breath EXAM: CT ABDOMEN AND PELVIS WITHOUT CONTRAST TECHNIQUE: Multidetector CT imaging of the abdomen and pelvis was performed following the standard protocol without IV contrast. COMPARISON:  None. FINDINGS: Cardiovascular: There is a small pericardial effusion. Coronary artery calcifications are seen. Scattered mild atherosclerosis is noted. 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 Mediastinum/Nodes: Limited due to the lack of intravenous contrast, however there does appear to be scattered subcarinal and prevascular lymph nodes seen. The thyroid gland, trachea and esophagus demonstrate no significant findings. Lungs/Pleura: There is multifocal extensive patchy airspace opacities seen throughout both lungs. There is also mild increased interstitial thickening at the lung bases. A small to moderate right and trace left pleural  effusion is seen. Upper abdomen: The visualized portion of the upper abdomen is unremarkable. Musculoskeletal/Chest wall: There is no chest wall mass or suspicious osseous finding. No acute osseous abnormality Lower chest: The visualized heart size within normal limits. No pericardial fluid/thickening. No hiatal hernia. The visualized portions of the lungs are clear. Hepatobiliary: Although limited due to the lack of intravenous contrast, normal in appearance without gross focal abnormality. No evidence of calcified gallstones or biliary ductal dilatation. Pancreas:  Unremarkable.  No surrounding inflammatory changes. Spleen: Normal in size. Although limited due to the lack of intravenous contrast, normal in appearance. Adrenals/Urinary Tract: Both adrenal glands appear normal. The kidneys and collecting system appear normal without evidence of urinary tract calculus or hydronephrosis. Bladder is unremarkable. Stomach/Bowel: The stomach, small bowel, and colon are normal in appearance. No inflammatory changes or obstructive findings. appendix is normal. Vascular/Lymphatic: There are no enlarged abdominal or pelvic lymph nodes. Scattered aortic atherosclerotic calcifications are seen without aneurysmal dilatation. Reproductive: The prostate is unremarkable. Other: No evidence of abdominal wall mass or hernia. Musculoskeletal: No acute or significant osseous findings. IMPRESSION: Multifocal patchy airspace opacities seen throughout both lungs. There are a  spectrum of findings in the lungs which can be seen with acute atypical infection, as well as multifocal metastatic disease (no definite primary malignancy identified). Small to moderate right and trace left pleural effusion. Mediastinal adenopathy, likely reactive No acute intra-abdominal or pelvic pathology. Aortic Atherosclerosis (ICD10-I70.0). Electronically Signed   By: Prudencio Pair M.D.   On: 08/28/2019 22:46   Ct Angio Chest Pe W Or Wo Contrast  Result  Date: 08/30/2019 CLINICAL DATA:  Intermediate probability for pulmonary embolism. Positive D-dimer EXAM: CT ANGIOGRAPHY CHEST WITH CONTRAST TECHNIQUE: Multidetector CT imaging of the chest was performed using the standard protocol during bolus administration of intravenous contrast. Multiplanar CT image reconstructions and MIPs were obtained to evaluate the vascular anatomy. CONTRAST:  38m OMNIPAQUE IOHEXOL 350 MG/ML SOLN COMPARISON:  Noncontrast CT from yesterday FINDINGS: Cardiovascular: Normal heart size. Small pericardial effusion. There is likely smooth serosal enhancement, certainty limited by contrast timing. Patient has history of infective endocarditis per the chart. Negative aorta. No pulmonary embolism is seen. Coronary atherosclerosis that is notable for age. Mediastinum/Nodes: Negative for adenopathy or collection. Lungs/Pleura: Irregularly marginated rounded nodules and consolidative opacities in the bilateral lungs with areas of central cavitation that are non drainable. Bilateral small pleural effusion with scalloping best seen on the right, progressed. No pneumothorax. Upper Abdomen: Negative Musculoskeletal: No evidence of spinal infection. Review of the MIP images confirms the above findings. IMPRESSION: 1. Multifocal cavitary pneumonia consistent with septic emboli. No emboli large enough for visualization by CTA. 2. Small pleural effusions that are complex and increased from yesterday, right larger than left. 3. Small pericardial effusion with possible pericarditis. Electronically Signed   By: JMonte FantasiaM.D.   On: 08/30/2019 06:38   Dg Chest Port 1 View  Result Date: 08/28/2019 CLINICAL DATA:  Right-sided chest pain and shortness of breath for 3 days. Diaphoresis. EXAM: PORTABLE CHEST 1 VIEW COMPARISON:  None. FINDINGS: Heart size is within normal limits allowing for low lung volumes. Bibasilar atelectasis noted. Multifocal ill-defined nodular opacities are seen in both lungs which  could be infectious, inflammatory, or neoplastic in etiology. IMPRESSION: Low lung volumes with ill-defined bilateral nodular opacities, which could be infectious, inflammatory or neoplastic in etiology. Recommend further evaluation with two-view chest at full inspiration, or chest CT. Electronically Signed   By: JMarlaine HindM.D.   On: 08/21/2019 19:39    EKG: Independently reviewed.  Sinus tachycardia, diffuse ST elevations.  No prior EKG for comparison.  Assessment/Plan Principal Problem:   Suspected COVID-19 virus infection Active Problems:   Severe sepsis (HCC)   Acute viral pericarditis   Respiratory distress   Hemoptysis   Severe sepsis Suspected COVID-19 viral infection Temperature 102.4 F.  Extremely tachycardic and tachypneic.  Extremely diaphoretic.  Initially hypotensive, blood pressure now improving with IV fluid resuscitation.  Received 3 L fluid boluses so far.  White blood cell count 200.1with neutrophilic predominance.  Procalcitonin 7.65.  Lactic acid 2.8 >>3.2.  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 -Patient is very ill-appearing and has not improved despite receiving antibiotics and IV fluid resuscitation.  Discussed with PCCM, did not feel the patient requires ICU admission.  Recommending continuing IV fluid resuscitation and giving 5% albumin. -Continue IV fluid resuscitation, additional 1 L fluid bolus ordered.  Albumin 5% ordered. -Received ceftriaxone and azithromycin.  Will broaden antibiotic coverage with vancomycin and cefepime. -Blood cultures pending -Continue to trend lactate -TTE ordered -UA with questionable UTI.  Receiving antibiotics.  Urine culture pending. -SARS-CoV-2  test negative.  However, suspicion remains high given multifocal airspace opacities suspicious for atypical infection seen on CT.  Inflammatory markers significantly elevated.  Repeat SARS-CoV-2 test ordered.  Continue airborne and contact precautions.  Suspected  acute viral pericarditis Patient with complaints of pleuritic chest pain.  High-sensitivity troponin 17 >> 101.  EKG with diffuse ST elevations.  ED provider discussed the case with cardiology. -Echocardiogram -Colchicine 0.3 mg daily for 3 months -Prednisone 40 mg daily with plans to taper x1 month -Avoid NSAIDs given suspicion for COVID-19 -Continue to trend troponin  Respiratory distress, tachypnea Extremely tachypneic but not hypoxic.  Respiratory rate up to 40s.  ABG with pH 7.44, PCO2 29, PO2 97.  Sepsis likely contributing.  Chest CT with multifocal airspace opacities concerning for Covid pneumonia.  Small to moderate right and trace left pleural effusion.  There remains suspicion for possible PE given significantly elevated D-dimer at 9.44. -Stat CT angiogram chest ordered to rule out PE.. -Repeat SARS-CoV-2 test pending. -Respiratory viral panel  Hemoptysis Patient is coughing up sputum mixed with blood.  Hemoglobin 15.1 initially, repeat 15.1.  Hemoglobin down to 13.3 this morning, likely hemodilution as he has received several IV fluid boluses. -Continue to monitor closely -Monitor H&H -Sputum culture  Elevated LFTs Likely related to sepsis. AST 65, ALT 66, alk phos 128, and T bili 4.8.  CT abdomen pelvis without evidence of gallstones or biliary ductal dilatation. -Continue to monitor LFTs -Right upper quadrant ultrasound  Hypovolemic hyponatremia Corrected sodium 129.   -IV fluid hydration -Continue to monitor sodium level -Serum osmolarity  AKI Likely prerenal due to dehydration and severe sepsis.  BUN 41, creatinine 2.7.  No prior labs for comparison.  Creatinine improved to 1.8 with IV fluid hydration. -IV fluid hydration -Continue to monitor renal function -Avoid nephrotoxic agents  HIV screening The patient falls between the ages of 13-64 and should be screened for HIV, therefore HIV testing ordered.  DVT prophylaxis: SCDs Code Status: Full code Family  Communication: No family available. Disposition Plan: Anticipate discharge improvement. Consults called: PCCM (Dr. Lucile Shutters) Admission status: It is my clinical opinion that admission to INPATIENT is reasonable and necessary in this 48 y.o. male  presenting with severe sepsis secondary to suspected infective endocarditis, acute viral pericarditis, suspicion for COVID-19 viral infection.  Please read note above.  Very high risk of decompensation.  Given the aforementioned, the predictability of an adverse outcome is felt to be significant. I expect that the patient will require at least 2 midnights in the hospital to treat this condition.   The medical decision making on this patient was of high complexity and the patient is at high risk for clinical deterioration, therefore this is a level 3 visit.  Shela Leff MD [facility] Pager [phone]  If 7PM-7AM, please contact night-coverage [email or web address] Password [email or web address]  08/30/2019, 7:14 AM

## 2019-08-30 NOTE — ED Notes (Signed)
SDU 

## 2019-08-30 NOTE — Progress Notes (Signed)
PHARMACY - PHYSICIAN COMMUNICATION CRITICAL VALUE ALERT - BLOOD CULTURE IDENTIFICATION (BCID)  Scott Strong is an 48 y.o. male who presented to [facility] on 08/27/2019 with a chief complaint of chest pain and SOB.  Assessment: All three blood culture bottles are growing Gram positive cocci. BCID identified MRSA.   Name of physician (or Provider) Contacted: Dr. Karleen Hampshire  Current antibiotics: Vancomycin and Cefepime  Changes to prescribed antibiotics recommended:  Continue Vancomycin  Discontinue Cefepime  Results for orders placed or performed during the hospital encounter of 08/17/2019  Blood Culture ID Panel (Reflexed) (Collected: 09/02/2019  6:56 PM)  Result Value Ref Range   Enterococcus species NOT DETECTED NOT DETECTED   Listeria monocytogenes NOT DETECTED NOT DETECTED   Staphylococcus species DETECTED (A) NOT DETECTED   Staphylococcus aureus (BCID) DETECTED (A) NOT DETECTED   Methicillin resistance DETECTED (A) NOT DETECTED   Streptococcus species NOT DETECTED NOT DETECTED   Streptococcus agalactiae NOT DETECTED NOT DETECTED   Streptococcus pneumoniae NOT DETECTED NOT DETECTED   Streptococcus pyogenes NOT DETECTED NOT DETECTED   Acinetobacter baumannii NOT DETECTED NOT DETECTED   Enterobacteriaceae species NOT DETECTED NOT DETECTED   Enterobacter cloacae complex NOT DETECTED NOT DETECTED   Escherichia coli NOT DETECTED NOT DETECTED   Klebsiella oxytoca NOT DETECTED NOT DETECTED   Klebsiella pneumoniae NOT DETECTED NOT DETECTED   Proteus species NOT DETECTED NOT DETECTED   Serratia marcescens NOT DETECTED NOT DETECTED   Haemophilus influenzae NOT DETECTED NOT DETECTED   Neisseria meningitidis NOT DETECTED NOT DETECTED   Pseudomonas aeruginosa NOT DETECTED NOT DETECTED   Candida albicans NOT DETECTED NOT DETECTED   Candida glabrata NOT DETECTED NOT DETECTED   Candida krusei NOT DETECTED NOT DETECTED   Candida parapsilosis NOT DETECTED NOT DETECTED   Candida tropicalis NOT  DETECTED NOT DETECTED    Scott Strong 08/30/2019  11:12 AM

## 2019-08-30 NOTE — Progress Notes (Signed)
[facility] Progress Note Patient Name: Bernerd Terhune DOB: [birth year] MRN: [id number]   Date of Service  08/30/2019  HPI/Events of Note  Blood pressure was transiently elevated when he got up but it's back to normal  eICU Interventions  No intervention.        Kerry Kass Roby Donaway 08/30/2019, 10:12 PM

## 2019-08-30 NOTE — Progress Notes (Addendum)
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  He was found to be in sepsis from MRSA Bacteremia, acute respiratory failure with hypoxia sec to septic emboli and in acute pericarditis.   Assessment & Plan:   Principal Problem:   Suspected COVID-19 virus infection Active Problems:   Severe sepsis (HCC)   Acute viral pericarditis   Respiratory distress   Hemoptysis  Severe sepsis from MRSA Bacteremia; Blood cultures from 3 bottles growing MRSA. ON IV vancomycin.  Echocardiogram ordered .  Fluid resuscitation to keep MAP >65.  PCCM consulted and transfer the patient to ICU/ PCCM service.    Acute respiratory failure with hypoxia and hemoptysis secondary to pulmonary septic emboli  Continue with IV vancomycin. Currently he is on 5 lit of [place] oxygen, will need BIPAP .  ABG reviewed.    Acute pericarditis Unclear etiology.  Check stat echocardiogram to evaluate for pericardial effusion, vegetations and LVEF.  EKG shows diffuse ST t wave elevations , minimally elevated troponins probably from demand ischemia from sepsis Cardiology consulted and on board.      Consultants:   PCCM,   Cardiology  Infectious disease.   Procedures:Echocardiogram.  MRI brain with and without contrast   Antimicrobials:vancomycin since admission.  One dose of cefepime.   Subjective: Reports having back pain, sob.   Objective: Vitals:   08/30/19 1205 08/30/19 1210 08/30/19 1215 08/30/19 1300  BP:  97/70 105/66 105/75  Pulse: (!) 31  (!) 110   Resp: (!) 21 (!) 37 (!) 29 (!) 34  Temp:      TempSrc:      SpO2: (!) 88%  95% 95%  Weight:      Height:        Intake/Output Summary (Last 24 hours) at 08/30/2019 1316 Last data filed at 08/30/2019 1005 Gross per 24 hour  Intake 4296.51 ml  Output 350 ml  Net 3946.51 ml     Filed Weights   08/23/2019 1845  Weight: 81.6 kg    Examination:  General exam: in mod distress from sob.  Respiratory system: diminished air entry throughout.  Cardiovascular system: S1 & S2 heard, tachycardic, no JVD, No pedal edema.  Gastrointestinal system: Abdomen is nondistended, soft and nontender. No organomegaly or masses felt. Normal bowel sounds heard. Central nervous system: Alert  But slow to respond. No focal deficits.  Extremities:  No pedal edema or cyanosis.  Skin: No rashes, lesions or ulcers Psychiatry: flat affect    Data Reviewed: I have personally reviewed following labs and imaging studies  CBC: Recent Labs  Lab 08/19/2019 1838 08/30/19 0249 08/30/19 0546  WBC 23.8* 22.4*  --   NEUTROABS 20.2*  --   --   HGB 15.1 15.1 13.3  HCT 43.8 42.9 39.0  MCV 90.5 89.9  --   PLT 281 247  --    Basic Metabolic Panel: Recent Labs  Lab 08/31/2019 1838 08/30/19 0249 08/30/19 0546  NA 128* 130* 130*  K 4.4 5.1 4.2  CL 90* 95*  --   CO2 22 20*  --   GLUCOSE 176* 102*  --   BUN 41* 35*  --   CREATININE 2.70* 1.85*  --   CALCIUM 8.8* 8.5*  --    GFR: Estimated Creatinine Clearance: 47.2 mL/min (A) (by C-G formula  based on SCr of 1.85 mg/dL (H)). Liver Function Tests: Recent Labs  Lab 08/17/2019 1838 08/30/19 0249  AST 65* 68*  ALT 66* 57*  ALKPHOS 128* 111  BILITOT 4.8* 4.8*  PROT 7.9 7.2  ALBUMIN 2.4* 2.2*   Recent Labs  Lab 08/15/2019 1838  LIPASE 173*   No results for input(s): AMMONIA in the last 168 hours. Coagulation Profile: Recent Labs  Lab 08/30/2019 1838  INR 1.4*   Cardiac Enzymes: No results for input(s): CKTOTAL, CKMB, CKMBINDEX, TROPONINI in the last 168 hours. BNP (last 3 results) No results for input(s): PROBNP in the last 8760 hours. HbA1C: No results for input(s): HGBA1C in the last 72 hours. CBG: Recent Labs  Lab 09/02/2019 1834 08/30/19 0529 08/30/19 1230  GLUCAP 173* 93 171*   Lipid Profile: Recent Labs     08/30/19 0249  TRIG 163*   Thyroid Function Tests: No results for input(s): TSH, T4TOTAL, FREET4, T3FREE, THYROIDAB in the last 72 hours. Anemia Panel: Recent Labs    08/30/19 0249  FERRITIN 642*   Sepsis Labs: Recent Labs  Lab 09/02/2019 2300 08/30/19 0249 08/30/19 0727 08/30/19 1000  PROCALCITON  --  7.65  --   --   LATICACIDVEN 2.6* 3.2* 2.8* 3.3*    Recent Results (from the past 240 hour(s))  Culture, blood (Routine x 2)     Status: None (Preliminary result)   Collection Time: 09/03/2019  6:30 PM   Specimen: BLOOD  Result Value Ref Range Status   Specimen Description BLOOD RIGHT ANTECUBITAL  Final   Special Requests   Final    BOTTLES DRAWN AEROBIC AND ANAEROBIC Blood Culture adequate volume   Culture  Setup Time   Final    IN BOTH AEROBIC AND ANAEROBIC BOTTLES GRAM POSITIVE COCCI CRITICAL RESULT CALLED TO, READ BACK BY AND VERIFIED WITH: G ABBOTT PHARMD 08/30/19 0717 JDW    Culture   Final    CULTURE REINCUBATED FOR BETTER GROWTH Performed at [facility], [address] [place], [place] [address]    Report Status PENDING  Incomplete  Culture, blood (Routine x 2)     Status: None (Preliminary result)   Collection Time: 08/28/2019  6:56 PM   Specimen: BLOOD RIGHT HAND  Result Value Ref Range Status   Specimen Description BLOOD RIGHT HAND  Final   Special Requests   Final    BOTTLES DRAWN AEROBIC ONLY Blood Culture results may not be optimal due to an inadequate volume of blood received in culture bottles   Culture  Setup Time   Final    GRAM POSITIVE COCCI IN CLUSTERS AEROBIC BOTTLE ONLY CRITICAL RESULT CALLED TO, READ BACK BY AND VERIFIED WITH: Donnie Mesa, AT [id number] 08/30/19 Organism ID to follow Performed at [facility], [address] [place], [place] [address]    Culture GRAM POSITIVE COCCI  Final   Report Status PENDING  Incomplete  Blood Culture ID Panel (Reflexed)     Status: Abnormal   Collection Time: 09/03/2019  6:56 PM  Result Value  Ref Range Status   Enterococcus species NOT DETECTED NOT DETECTED Final   Listeria monocytogenes NOT DETECTED NOT DETECTED Final   Staphylococcus species DETECTED (A) NOT DETECTED Final    Comment: CRITICAL RESULT CALLED TO, READ BACK BY AND VERIFIED WITH: Artelia Laroche PharmD 10:30 08/29/29 ([email or web address])    Staphylococcus aureus (BCID) DETECTED (A) NOT DETECTED Final    Comment: Methicillin (oxacillin)-resistant Staphylococcus aureus (MRSA). MRSA is predictably resistant to  beta-lactam antibiotics (except ceftaroline). Preferred therapy is vancomycin unless clinically contraindicated. Patient requires contact precautions if  hospitalized. CRITICAL RESULT CALLED TO, READ BACK BY AND VERIFIED WITH: Artelia Laroche PharmD 10:30 08/30/19 ([email or web address])    Methicillin resistance DETECTED (A) NOT DETECTED Final    Comment: CRITICAL RESULT CALLED TO, READ BACK BY AND VERIFIED WITH: Artelia Laroche PharmD 10:30 08/30/19 ([email or web address])    Streptococcus species NOT DETECTED NOT DETECTED Final   Streptococcus agalactiae NOT DETECTED NOT DETECTED Final   Streptococcus pneumoniae NOT DETECTED NOT DETECTED Final   Streptococcus pyogenes NOT DETECTED NOT DETECTED Final   Acinetobacter baumannii NOT DETECTED NOT DETECTED Final   Enterobacteriaceae species NOT DETECTED NOT DETECTED Final   Enterobacter cloacae complex NOT DETECTED NOT DETECTED Final   Escherichia coli NOT DETECTED NOT DETECTED Final   Klebsiella oxytoca NOT DETECTED NOT DETECTED Final   Klebsiella pneumoniae NOT DETECTED NOT DETECTED Final   Proteus species NOT DETECTED NOT DETECTED Final   Serratia marcescens NOT DETECTED NOT DETECTED Final   Haemophilus influenzae NOT DETECTED NOT DETECTED Final   Neisseria meningitidis NOT DETECTED NOT DETECTED Final   Pseudomonas aeruginosa NOT DETECTED NOT DETECTED Final   Candida albicans NOT DETECTED NOT DETECTED Final   Candida glabrata NOT DETECTED NOT DETECTED Final   Candida krusei NOT DETECTED NOT DETECTED Final    Candida parapsilosis NOT DETECTED NOT DETECTED Final   Candida tropicalis NOT DETECTED NOT DETECTED Final    Comment: Performed at [facility], [address], [place], [place] [address]  SARS CORONAVIRUS 2 (TAT 6-24 HRS) Nasopharyngeal Nasopharyngeal Swab     Status: None   Collection Time: 08/09/2019 10:11 PM   Specimen: Nasopharyngeal Swab  Result Value Ref Range Status   SARS Coronavirus 2 NEGATIVE NEGATIVE Final    Comment: (NOTE) SARS-CoV-2 target nucleic acids are NOT DETECTED. The SARS-CoV-2 RNA is generally detectable in upper and lower respiratory specimens during the acute phase of infection. Negative results do not preclude SARS-CoV-2 infection, do not rule out co-infections with other pathogens, and should not be used as the sole basis for treatment or other patient management decisions. Negative results must be combined with clinical observations, patient history, and epidemiological information. The expected result is Negative. Fact Sheet for Patients: HairSlick.no Fact Sheet for Healthcare Providers: quierodirigir.com This test is not yet approved or cleared by the [place] FDA and  has been authorized for detection and/or diagnosis of SARS-CoV-2 by FDA under an Emergency Use Authorization (EUA). This EUA will remain  in effect (meaning this test can be used) for the duration of the COVID-19 declaration under Section 56 4(b)(1) of the Act, 21 U.S.C. section 360bbb-3(b)(1), unless the authorization is terminated or revoked sooner. Performed at [facility], [address], [place], [place] [address]   SARS CORONAVIRUS 2 (TAT 6-24 HRS) Nasopharyngeal Nasopharyngeal Swab     Status: None   Collection Time: 08/30/19  5:38 AM   Specimen: Nasopharyngeal Swab  Result Value Ref Range Status   SARS Coronavirus 2 NEGATIVE NEGATIVE Final    Comment: (NOTE) SARS-CoV-2 target nucleic acids are NOT  DETECTED. The SARS-CoV-2 RNA is generally detectable in upper and lower respiratory specimens during the acute phase of infection. Negative results do not preclude SARS-CoV-2 infection, do not rule out co-infections with other pathogens, and should not be used as the sole basis for treatment or other patient management decisions. Negative results must be combined with clinical observations, patient history, and epidemiological information. The  expected result is Negative. Fact Sheet for Patients: SugarRoll.be Fact Sheet for Healthcare Providers: https://www.woods-mathews.com/ This test is not yet approved or cleared by the [place] FDA and  has been authorized for detection and/or diagnosis of SARS-CoV-2 by FDA under an Emergency Use Authorization (EUA). This EUA will remain  in effect (meaning this test can be used) for the duration of the COVID-19 declaration under Section 56 4(b)(1) of the Act, 21 U.S.C. section 360bbb-3(b)(1), unless the authorization is terminated or revoked sooner. Performed at [facility], [place] [address], [place], [place] [address]   Culture, respiratory     Status: None (Preliminary result)   Collection Time: 08/30/19  5:39 AM   Specimen: SPU  Result Value Ref Range Status   Specimen Description SPUTUM  Final   Special Requests NONE  Final   Gram Stain   Final    NO WBC SEEN NO ORGANISMS SEEN Performed at [facility], [place] [address], [place], [place] [address]    Culture PENDING  Incomplete   Report Status PENDING  Incomplete  Respiratory Panel by PCR     Status: Abnormal   Collection Time: 08/30/19  7:27 AM   Specimen: Nasopharyngeal Swab; Respiratory  Result Value Ref Range Status   Adenovirus NOT DETECTED NOT DETECTED Final   Coronavirus 229E NOT DETECTED NOT DETECTED Final    Comment: (NOTE) The Coronavirus on the Respiratory Panel, DOES NOT test for the novel  Coronavirus (2019  nCoV)    Coronavirus HKU1 NOT DETECTED NOT DETECTED Final   Coronavirus NL63 NOT DETECTED NOT DETECTED Final   Coronavirus OC43 NOT DETECTED NOT DETECTED Final   Metapneumovirus NOT DETECTED NOT DETECTED Final   Rhinovirus / Enterovirus DETECTED (A) NOT DETECTED Final   Influenza A NOT DETECTED NOT DETECTED Final   Influenza B NOT DETECTED NOT DETECTED Final   Parainfluenza Virus 1 NOT DETECTED NOT DETECTED Final   Parainfluenza Virus 2 NOT DETECTED NOT DETECTED Final   Parainfluenza Virus 3 NOT DETECTED NOT DETECTED Final   Parainfluenza Virus 4 NOT DETECTED NOT DETECTED Final   Respiratory Syncytial Virus NOT DETECTED NOT DETECTED Final   Bordetella pertussis NOT DETECTED NOT DETECTED Final   Chlamydophila pneumoniae NOT DETECTED NOT DETECTED Final   Mycoplasma pneumoniae NOT DETECTED NOT DETECTED Final    Comment: Performed at [facility], [place] [place], [place] [address]         Radiology Studies: Ct Abdomen Pelvis Wo Contrast  Result Date: 09-24-2019 CLINICAL DATA:  Abdominal pain and shortness of breath EXAM: CT ABDOMEN AND PELVIS WITHOUT CONTRAST TECHNIQUE: Multidetector CT imaging of the abdomen and pelvis was performed following the standard protocol without IV contrast. COMPARISON:  None. FINDINGS: Cardiovascular: There is a small pericardial effusion. Coronary artery calcifications are seen. Scattered mild atherosclerosis is noted. 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 Mediastinum/Nodes: Limited due to the lack of intravenous contrast, however there does appear to be scattered subcarinal and prevascular lymph nodes seen. The thyroid gland, trachea and esophagus demonstrate no significant findings. Lungs/Pleura: There is multifocal extensive patchy airspace opacities seen throughout both lungs. There is also mild increased interstitial thickening at the lung bases. A small to moderate right and trace left pleural effusion is seen. Upper  abdomen: The visualized portion of the upper abdomen is unremarkable. Musculoskeletal/Chest wall: There is no chest wall mass or suspicious osseous finding. No acute osseous abnormality Lower chest: The visualized heart size within normal limits.  No pericardial fluid/thickening. No hiatal hernia. The visualized portions of the lungs are clear. Hepatobiliary: Although limited due to the lack of intravenous contrast, normal in appearance without gross focal abnormality. No evidence of calcified gallstones or biliary ductal dilatation. Pancreas:  Unremarkable.  No surrounding inflammatory changes. Spleen: Normal in size. Although limited due to the lack of intravenous contrast, normal in appearance. Adrenals/Urinary Tract: Both adrenal glands appear normal. The kidneys and collecting system appear normal without evidence of urinary tract calculus or hydronephrosis. Bladder is unremarkable. Stomach/Bowel: The stomach, small bowel, and colon are normal in appearance. No inflammatory changes or obstructive findings. appendix is normal. Vascular/Lymphatic: There are no enlarged abdominal or pelvic lymph nodes. Scattered aortic atherosclerotic calcifications are seen without aneurysmal dilatation. Reproductive: The prostate is unremarkable. Other: No evidence of abdominal wall mass or hernia. Musculoskeletal: No acute or significant osseous findings. IMPRESSION: Multifocal patchy airspace opacities seen throughout both lungs. There are a spectrum of findings in the lungs which can be seen with acute atypical infection, as well as multifocal metastatic disease (no definite primary malignancy identified). Small to moderate right and trace left pleural effusion. Mediastinal adenopathy, likely reactive No acute intra-abdominal or pelvic pathology. Aortic Atherosclerosis (ICD10-I70.0). Electronically Signed   By: Jonna Clark M.D.   On: Sep 17, 2019 22:46   Ct Chest Wo Contrast  Result Date: Sep 17, 2019 CLINICAL DATA:   Abdominal pain and shortness of breath EXAM: CT ABDOMEN AND PELVIS WITHOUT CONTRAST TECHNIQUE: Multidetector CT imaging of the abdomen and pelvis was performed following the standard protocol without IV contrast. COMPARISON:  None. FINDINGS: Cardiovascular: There is a small pericardial effusion. Coronary artery calcifications are seen. Scattered mild atherosclerosis is noted. 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 Mediastinum/Nodes: Limited due to the lack of intravenous contrast, however there does appear to be scattered subcarinal and prevascular lymph nodes seen. The thyroid gland, trachea and esophagus demonstrate no significant findings. Lungs/Pleura: There is multifocal extensive patchy airspace opacities seen throughout both lungs. There is also mild increased interstitial thickening at the lung bases. A small to moderate right and trace left pleural effusion is seen. Upper abdomen: The visualized portion of the upper abdomen is unremarkable. Musculoskeletal/Chest wall: There is no chest wall mass or suspicious osseous finding. No acute osseous abnormality Lower chest: The visualized heart size within normal limits. No pericardial fluid/thickening. No hiatal hernia. The visualized portions of the lungs are clear. Hepatobiliary: Although limited due to the lack of intravenous contrast, normal in appearance without gross focal abnormality. No evidence of calcified gallstones or biliary ductal dilatation. Pancreas:  Unremarkable.  No surrounding inflammatory changes. Spleen: Normal in size. Although limited due to the lack of intravenous contrast, normal in appearance. Adrenals/Urinary Tract: Both adrenal glands appear normal. The kidneys and collecting system appear normal without evidence of urinary tract calculus or hydronephrosis. Bladder is unremarkable. Stomach/Bowel: The stomach, small bowel, and colon are normal in appearance. No inflammatory changes or obstructive findings.  appendix is normal. Vascular/Lymphatic: There are no enlarged abdominal or pelvic lymph nodes. Scattered aortic atherosclerotic calcifications are seen without aneurysmal dilatation. Reproductive: The prostate is unremarkable. Other: No evidence of abdominal wall mass or hernia. Musculoskeletal: No acute or significant osseous findings. IMPRESSION: Multifocal patchy airspace opacities seen throughout both lungs. There are a spectrum of findings in the lungs which can be seen with acute atypical infection, as well as multifocal metastatic disease (no definite primary malignancy identified). Small to moderate right and trace left pleural effusion. Mediastinal adenopathy, likely reactive No  acute intra-abdominal or pelvic pathology. Aortic Atherosclerosis (ICD10-I70.0). Electronically Signed   By: Jonna Clark M.D.   On: Sep 18, 2019 22:46   Ct Angio Chest Pe W Or Wo Contrast  Result Date: 08/30/2019 CLINICAL DATA:  Intermediate probability for pulmonary embolism. Positive D-dimer EXAM: CT ANGIOGRAPHY CHEST WITH CONTRAST TECHNIQUE: Multidetector CT imaging of the chest was performed using the standard protocol during bolus administration of intravenous contrast. Multiplanar CT image reconstructions and MIPs were obtained to evaluate the vascular anatomy. CONTRAST:  63mL OMNIPAQUE IOHEXOL 350 MG/ML SOLN COMPARISON:  Noncontrast CT from yesterday FINDINGS: Cardiovascular: Normal heart size. Small pericardial effusion. There is likely smooth serosal enhancement, certainty limited by contrast timing. Patient has history of infective endocarditis per the chart. Negative aorta. No pulmonary embolism is seen. Coronary atherosclerosis that is notable for age. Mediastinum/Nodes: Negative for adenopathy or collection. Lungs/Pleura: Irregularly marginated rounded nodules and consolidative opacities in the bilateral lungs with areas of central cavitation that are non drainable. Bilateral small pleural effusion with scalloping  best seen on the right, progressed. No pneumothorax. Upper Abdomen: Negative Musculoskeletal: No evidence of spinal infection. Review of the MIP images confirms the above findings. IMPRESSION: 1. Multifocal cavitary pneumonia consistent with septic emboli. No emboli large enough for visualization by CTA. 2. Small pleural effusions that are complex and increased from yesterday, right larger than left. 3. Small pericardial effusion with possible pericarditis. Electronically Signed   By: Marnee Spring M.D.   On: 08/30/2019 06:38   Dg Chest Port 1 View  Result Date: 09/18/19 CLINICAL DATA:  Right-sided chest pain and shortness of breath for 3 days. Diaphoresis. EXAM: PORTABLE CHEST 1 VIEW COMPARISON:  None. FINDINGS: Heart size is within normal limits allowing for low lung volumes. Bibasilar atelectasis noted. Multifocal ill-defined nodular opacities are seen in both lungs which could be infectious, inflammatory, or neoplastic in etiology. IMPRESSION: Low lung volumes with ill-defined bilateral nodular opacities, which could be infectious, inflammatory or neoplastic in etiology. Recommend further evaluation with two-view chest at full inspiration, or chest CT. Electronically Signed   By: Danae Orleans M.D.   On: 2019/09/18 19:39   US Abdomen Limited Ruq  Result Date: 08/30/2019 CLINICAL DATA:  Elevated LFTs EXAM: ULTRASOUND ABDOMEN LIMITED RIGHT UPPER QUADRANT COMPARISON:  None. FINDINGS: Gallbladder: No gallstones or wall thickening visualized. No sonographic Murphy sign noted by sonographer. Common bile duct: Diameter: 3.1 mm. Liver: No focal lesion identified. Within normal limits in parenchymal echogenicity. Portal vein is patent on color Doppler imaging with normal direction of blood flow towards the liver. Other: Small right pleural effusion is noted similar to that seen on prior CT examination. IMPRESSION: Unremarkable right upper quadrant ultrasound. Right pleural effusion similar to that seen on  prior exam. Electronically Signed   By: Alcide Clever M.D.   On: 08/30/2019 08:55        Scheduled Meds:  colchicine  0.3 mg Oral Daily   predniSONE  40 mg Oral Q breakfast   Continuous Infusions:  sodium chloride 150 mL/hr at 08/30/19 0731   sodium chloride     [START ON 08/31/2019] vancomycin       LOS: 0 days       Kathlen Mody, MD [facility] Pager [phone]  If 7PM-7AM, please contact night-coverage [email or web address] Password [email or web address] 08/30/2019, 1:16 PM

## 2019-08-30 NOTE — ED Notes (Signed)
Pt becoming more tachypneic , 40-60 rr/min. RN visually counted 47 rr/min. Pt also cool and clammy. Dr. Baltazar Najjar paged.

## 2019-08-30 NOTE — Progress Notes (Signed)
Pharmacy Antibiotic Note  Scott Strong is a 48 y.o. male admitted on 08/18/2019 with chest pain/SOB, possible PNA. Pharmacy has been consulted for Vancomycin and Cefepime dosing. Pt is febrile with Tmax 102.4 and WBC is significantly elevated. Scr is down slightly from initial check and lactic acid remains elevated.   Plan: Continue cefepime 2g IV Q12H Vancomycin 1250mg  IV Q24H F/u renal fxn, C&S, clinical status and peak/trough at SS  Height: 5\' 8"  (172.7 cm) Weight: 180 lb (81.6 kg) IBW/kg (Calculated) : 68.4  Temp (24hrs), Avg:99.4 F (37.4 C), Min:97.9 F (36.6 C), Max:102.4 F (39.1 C)  Recent Labs  Lab 08/19/2019 1838 08/16/2019 2300 08/30/19 0249 08/30/19 0727  WBC 23.8*  --  22.4*  --   CREATININE 2.70*  --  1.85*  --   LATICACIDVEN 2.8* 2.6* 3.2* 2.8*    Estimated Creatinine Clearance: 47.2 mL/min (A) (by C-G formula based on SCr of 1.85 mg/dL (H)).    No Known Allergies   Vanc 10/22>> Cefepime 10/22>> CTX x 1 10/21 Azithro x 1 10/21  10/22 RVP - +rhinovirus/enterovirus 10/21 Blood - GPC in clusters  Scott Strong, Rande Lawman 08/30/2019 9:51 AM

## 2019-08-30 NOTE — Progress Notes (Signed)
  Echocardiogram 2D Echocardiogram has been performed.  Scott Strong 08/30/2019, 2:07 PM

## 2019-08-30 NOTE — Progress Notes (Signed)
Daily Nursing Note  Received patient from ED. Upon chart review notably in sepsis s/p Cefepime/Vanco/multiple fluid boluses. Blood Cx (+) for MRSA bacteremia. Patient is tachycardic and tachyneic upon admission to the unit. Stabilized in placed and provided information on safety precautions. RRT RN, Puja informed of patient.   MEWS score persistently in Red  Received lactate of 3.3, Dr. Karleen Hampshire informed  S/P Albumin   Patients blood pressure dropped to 56'O systolic/ Dr. Karleen Hampshire and Emilie Rutter informed promptly. We all convened at bedside. 2L NS bolus ordered. TTE stat. EKG - done, abnormal. CCM and Cardiology consulted.  Patient evaluated by CCM, identified to be appropriate for ICU level of care. Transfer orders written.  Awaiting critical care bed.  Dr. Baxter Flattery (ID) has seen patient, no isolation needed for COVID-19 given (-) test x2   Vital Signs MEWS/VS Documentation      08/30/2019 1205 08/30/2019 1210 08/30/2019 1215 08/30/2019 1300   MEWS Score:  6  6  3  4    MEWS Score Color:  Red  Red  Yellow  Red   Resp:  (!) 21  (!) 37  (!) 29  (!) 34   Pulse:  (!) 31  -  (!) 110  -   BP:  -  97/70  105/66  105/75   O2 Device:  -  -  Nasal Cannula  Nasal Cannula   O2 Flow Rate (L/min):  -  -  5 L/min  5 L/min     Scott Strong Scott Strong 08/30/2019,1:57 PM

## 2019-08-30 NOTE — Progress Notes (Signed)
Shift event: RN paged this NP around 0320 to report BP being soft and tachycardia. Pt had an order for Metoprolol which NP d/c'd secondary to soft BP. A Bolus was given at that time. His LA was 2.6 and per RN, he got several boluses prior to this LA result. NP ordered r/p LA. Later, RN paged NP again because pt had become tachypneic. HR still in 130s ST. NP to bedside. S: Pt states his SOB is the same as when he came to the ED. He denies CP. He feels "really bad".  O: Poor appearing male in mild distress. T 102.4 rectally BP 90s HR 130s RR 36. Sa02 94% Pt is responsive but appears sick. His skin is very diaphoretic and hot. Card: S1S2 with tachycardia. Lungs: CTA. He is breathing shallow but not working hard. No use of accessory muscles.  A/P: 1. Sepsis as shown by fever, tachycardia, hypotension, elevated LA, and tachypnea. LA went from 2.6 to 3.2. Giving another bolus. Recheck LA at 0700. Blood cx ordered. He was negative for COVID, but we will recheck.  2. PNA with suspected endocarditis-we have broadened his abx to Vanc and Cefepime.   3. Fever-Tylenol, IVF. 4. Hemoptysis-small amt of bloody sputum. Sent sputum cx.  5. Pleuritic chest pain-troponins flat.  6. Tachypnea-likely response to fever. He is in no resp distress. ABG OK.  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  8. Elevated DDimer accompanied by tachypnea and tachycardia. CTA chest to r/o PE.  Dr. Marlowe Sax of [facility] admitted pt and was informed about his change in status. She also saw patient.  CRITICAL CARE Performed by: Gardiner Barefoot   Total critical care time: Start 0500  End 0600 Total 60 minutes  Critical care time was exclusive of separately billable procedures and treating other patients.  Critical care was necessary to treat or prevent imminent or life-threatening deterioration.  Critical care was time spent personally by me on the following activities: development of treatment plan with patient and/or surrogate as well  as nursing, discussions with consultants, evaluation of patient's response to treatment, examination of patient, obtaining history from patient or surrogate, ordering and performing treatments and interventions, ordering and review of laboratory studies, ordering and review of radiographic studies, pulse oximetry and re-evaluation of patient's condition.

## 2019-08-30 NOTE — ED Provider Notes (Signed)
[facility] EMERGENCY DEPARTMENT Provider Note   CSN: [id number] Arrival date & time: 2019/09/07  1810     History   Chief Complaint Chief Complaint  Patient presents with   Chest Pain   Shortness of Breath    HPI Scott Strong is a 48 y.o. male.     HPI   3 days of shortness of breath, chest pain, cough, low grade fevers Reports he is here because his friend told him he had pneumonia Has had cough productive of yellow phlegm and blood tinged Fevers have been low around 100  Chest pain is sharp, vice like, severe with deep breaths, cough, located across whole chest Also has epigastric abdominal pain, nausea No vomiting, no diarrhea, no congestion or sore throat  Has sick contacts, kids with cold-like symptoms, not tested for COVID no known COVID exposures  No asymmetric leg swelling, no recent surgeries  No medical problems  History reviewed. No pertinent past medical history.  Patient Active Problem List   Diagnosis Date Noted   Suspected COVID-19 virus infection 08/30/2019    History reviewed. No pertinent surgical history.      Home Medications    Prior to Admission medications   Not on File    Family History No family history on file.  Social History Social History   Tobacco Use   Smoking status: Not on file  Substance Use Topics   Alcohol use: Not on file   Drug use: Not on file     Allergies   Patient has no known allergies.   Review of Systems Review of Systems  Constitutional: Positive for fatigue and fever.  HENT: Negative for congestion and sore throat.   Respiratory: Positive for cough and shortness of breath.   Cardiovascular: Positive for chest pain.  Gastrointestinal: Positive for abdominal pain and nausea. Negative for diarrhea and vomiting.  Genitourinary: Negative for difficulty urinating.  Skin: Negative for rash.  Neurological: Negative for headaches.     Physical Exam Updated Vital  Signs BP 102/82 (BP Location: Left Arm)    Pulse (!) 126    Temp 98.8 F (37.1 C) (Oral)    Resp 14    Ht 5\' 8"  (1.727 m)    Wt 81.6 kg    SpO2 100%    BMI 27.37 kg/m   Physical Exam Vitals signs and nursing note reviewed.  Constitutional:      General: He is in acute distress.     Appearance: He is well-developed. He is ill-appearing. He is not diaphoretic.  HENT:     Head: Normocephalic and atraumatic.  Eyes:     Conjunctiva/sclera: Conjunctivae normal.  Neck:     Musculoskeletal: Normal range of motion.  Cardiovascular:     Rate and Rhythm: Regular rhythm. Tachycardia present.     Heart sounds: Normal heart sounds. No murmur. No friction rub. No gallop.   Pulmonary:     Effort: Pulmonary effort is normal. Tachypnea present. No respiratory distress.     Breath sounds: Normal breath sounds. No wheezing or rales.     Comments: Frequent cough Abdominal:     General: There is no distension.     Palpations: Abdomen is soft.     Tenderness: There is abdominal tenderness (diffuse). There is no guarding.  Musculoskeletal:     Right lower leg: Edema present.     Left lower leg: Edema present.  Skin:    General: Skin is warm and dry.  Neurological:  Mental Status: He is alert and oriented to person, place, and time.      ED Treatments / Results  Labs (all labs ordered are listed, but only abnormal results are displayed) Labs Reviewed  COMPREHENSIVE METABOLIC PANEL - Abnormal; Notable for the following components:      Result Value   Sodium 128 (*)    Chloride 90 (*)    Glucose, Bld 176 (*)    BUN 41 (*)    Creatinine, Ser 2.70 (*)    Calcium 8.8 (*)    Albumin 2.4 (*)    AST 65 (*)    ALT 66 (*)    Alkaline Phosphatase 128 (*)    Total Bilirubin 4.8 (*)    GFR calc non Af Amer 27 (*)    GFR calc Af Amer 31 (*)    Anion gap 16 (*)    All other components within normal limits  LACTIC ACID, PLASMA - Abnormal; Notable for the following components:   Lactic Acid,  Venous 2.8 (*)    All other components within normal limits  LACTIC ACID, PLASMA - Abnormal; Notable for the following components:   Lactic Acid, Venous 2.6 (*)    All other components within normal limits  CBC WITH DIFFERENTIAL/PLATELET - Abnormal; Notable for the following components:   WBC 23.8 (*)    Neutro Abs 20.2 (*)    Monocytes Absolute 1.4 (*)    All other components within normal limits  PROTIME-INR - Abnormal; Notable for the following components:   Prothrombin Time 16.8 (*)    INR 1.4 (*)    All other components within normal limits  URINALYSIS, ROUTINE W REFLEX MICROSCOPIC - Abnormal; Notable for the following components:   Color, Urine AMBER (*)    APPearance CLOUDY (*)    Hgb urine dipstick MODERATE (*)    Protein, ur 100 (*)    Leukocytes,Ua MODERATE (*)    WBC, UA >50 (*)    All other components within normal limits  APTT - Abnormal; Notable for the following components:   aPTT 44 (*)    All other components within normal limits  LIPASE, BLOOD - Abnormal; Notable for the following components:   Lipase 173 (*)    All other components within normal limits  CBG MONITORING, ED - Abnormal; Notable for the following components:   Glucose-Capillary 173 (*)    All other components within normal limits  TROPONIN I (HIGH SENSITIVITY) - Abnormal; Notable for the following components:   Troponin I (High Sensitivity) 87 (*)    All other components within normal limits  CULTURE, BLOOD (ROUTINE X 2)  CULTURE, BLOOD (ROUTINE X 2)  URINE CULTURE  SARS CORONAVIRUS 2 (TAT 6-24 HRS)  SARS CORONAVIRUS 2 BY RT PCR (HOSPITAL ORDER, PERFORMED IN [facility])  D-DIMER, QUANTITATIVE (NOT AT [facility])  PROCALCITONIN  LACTATE DEHYDROGENASE  FERRITIN  TRIGLYCERIDES  FIBRINOGEN  C-REACTIVE PROTEIN  CBG MONITORING, ED  TROPONIN I (HIGH SENSITIVITY)    EKG EKG Interpretation  Date/Time:  Thursday August 30 2019 00:27:29 EDT Ventricular Rate:  129 PR  Interval:  140 QRS Duration: 81 QT Interval:  279 QTC Calculation: 409 R Axis:   75 Text Interpretation:  Sinus tachycardia ST elevation suggests acute pericarditis No significant change since last tracing earlier today Confirmed by Alvira Monday ([id number]) on 08/30/2019 12:38:41 AM   Radiology Ct Abdomen Pelvis Wo Contrast  Result Date: 08/22/2019 CLINICAL DATA:  Abdominal pain and shortness of breath EXAM: CT  ABDOMEN AND PELVIS WITHOUT CONTRAST TECHNIQUE: Multidetector CT imaging of the abdomen and pelvis was performed following the standard protocol without IV contrast. COMPARISON:  None. FINDINGS: Cardiovascular: There is a small pericardial effusion. Coronary artery calcifications are seen. Scattered mild atherosclerosis is noted. 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 Mediastinum/Nodes: Limited due to the lack of intravenous contrast, however there does appear to be scattered subcarinal and prevascular lymph nodes seen. The thyroid gland, trachea and esophagus demonstrate no significant findings. Lungs/Pleura: There is multifocal extensive patchy airspace opacities seen throughout both lungs. There is also mild increased interstitial thickening at the lung bases. A small to moderate right and trace left pleural effusion is seen. Upper abdomen: The visualized portion of the upper abdomen is unremarkable. Musculoskeletal/Chest wall: There is no chest wall mass or suspicious osseous finding. No acute osseous abnormality Lower chest: The visualized heart size within normal limits. No pericardial fluid/thickening. No hiatal hernia. The visualized portions of the lungs are clear. Hepatobiliary: Although limited due to the lack of intravenous contrast, normal in appearance without gross focal abnormality. No evidence of calcified gallstones or biliary ductal dilatation. Pancreas:  Unremarkable.  No surrounding inflammatory changes. Spleen: Normal in size. Although limited due to the  lack of intravenous contrast, normal in appearance. Adrenals/Urinary Tract: Both adrenal glands appear normal. The kidneys and collecting system appear normal without evidence of urinary tract calculus or hydronephrosis. Bladder is unremarkable. Stomach/Bowel: The stomach, small bowel, and colon are normal in appearance. No inflammatory changes or obstructive findings. appendix is normal. Vascular/Lymphatic: There are no enlarged abdominal or pelvic lymph nodes. Scattered aortic atherosclerotic calcifications are seen without aneurysmal dilatation. Reproductive: The prostate is unremarkable. Other: No evidence of abdominal wall mass or hernia. Musculoskeletal: No acute or significant osseous findings. IMPRESSION: Multifocal patchy airspace opacities seen throughout both lungs. There are a spectrum of findings in the lungs which can be seen with acute atypical infection, as well as multifocal metastatic disease (no definite primary malignancy identified). Small to moderate right and trace left pleural effusion. Mediastinal adenopathy, likely reactive No acute intra-abdominal or pelvic pathology. Aortic Atherosclerosis (ICD10-I70.0). Electronically Signed   By: Jonna Clark M.D.   On: 08/30/2019 22:46   Ct Chest Wo Contrast  Result Date: 08/10/2019 CLINICAL DATA:  Abdominal pain and shortness of breath EXAM: CT ABDOMEN AND PELVIS WITHOUT CONTRAST TECHNIQUE: Multidetector CT imaging of the abdomen and pelvis was performed following the standard protocol without IV contrast. COMPARISON:  None. FINDINGS: Cardiovascular: There is a small pericardial effusion. Coronary artery calcifications are seen. Scattered mild atherosclerosis is noted. 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 Mediastinum/Nodes: Limited due to the lack of intravenous contrast, however there does appear to be scattered subcarinal and prevascular lymph nodes seen. The thyroid gland, trachea and esophagus demonstrate no  significant findings. Lungs/Pleura: There is multifocal extensive patchy airspace opacities seen throughout both lungs. There is also mild increased interstitial thickening at the lung bases. A small to moderate right and trace left pleural effusion is seen. Upper abdomen: The visualized portion of the upper abdomen is unremarkable. Musculoskeletal/Chest wall: There is no chest wall mass or suspicious osseous finding. No acute osseous abnormality Lower chest: The visualized heart size within normal limits. No pericardial fluid/thickening. No hiatal hernia. The visualized portions of the lungs are clear. Hepatobiliary: Although limited due to the lack of intravenous contrast, normal in appearance without gross focal abnormality. No evidence of calcified gallstones or biliary ductal dilatation. Pancreas:  Unremarkable.  No  surrounding inflammatory changes. Spleen: Normal in size. Although limited due to the lack of intravenous contrast, normal in appearance. Adrenals/Urinary Tract: Both adrenal glands appear normal. The kidneys and collecting system appear normal without evidence of urinary tract calculus or hydronephrosis. Bladder is unremarkable. Stomach/Bowel: The stomach, small bowel, and colon are normal in appearance. No inflammatory changes or obstructive findings. appendix is normal. Vascular/Lymphatic: There are no enlarged abdominal or pelvic lymph nodes. Scattered aortic atherosclerotic calcifications are seen without aneurysmal dilatation. Reproductive: The prostate is unremarkable. Other: No evidence of abdominal wall mass or hernia. Musculoskeletal: No acute or significant osseous findings. IMPRESSION: Multifocal patchy airspace opacities seen throughout both lungs. There are a spectrum of findings in the lungs which can be seen with acute atypical infection, as well as multifocal metastatic disease (no definite primary malignancy identified). Small to moderate right and trace left pleural effusion.  Mediastinal adenopathy, likely reactive No acute intra-abdominal or pelvic pathology. Aortic Atherosclerosis (ICD10-I70.0). Electronically Signed   By: Jonna ClarkBindu  Avutu M.D.   On: May 01, 2019 22:46   Dg Chest Port 1 View  Result Date: May 01, 2019 CLINICAL DATA:  Right-sided chest pain and shortness of breath for 3 days. Diaphoresis. EXAM: PORTABLE CHEST 1 VIEW COMPARISON:  None. FINDINGS: Heart size is within normal limits allowing for low lung volumes. Bibasilar atelectasis noted. Multifocal ill-defined nodular opacities are seen in both lungs which could be infectious, inflammatory, or neoplastic in etiology. IMPRESSION: Low lung volumes with ill-defined bilateral nodular opacities, which could be infectious, inflammatory or neoplastic in etiology. Recommend further evaluation with two-view chest at full inspiration, or chest CT. Electronically Signed   By: Danae OrleansJohn A Stahl M.D.   On: May 01, 2019 19:39    Procedures .Critical Care Performed by: Alvira MondaySchlossman, Tashan Kreitzer, MD Authorized by: Alvira MondaySchlossman, Camie Hauss, MD   Critical care provider statement:    Critical care time (minutes):  45   Critical care was time spent personally by me on the following activities:  Discussions with consultants, evaluation of patient's response to treatment, examination of patient, ordering and performing treatments and interventions, ordering and review of laboratory studies, ordering and review of radiographic studies, pulse oximetry, re-evaluation of patient's condition, obtaining history from patient or surrogate and review of old charts   (including critical care time)  Medications Ordered in ED Medications  cefTRIAXone (ROCEPHIN) 2 g in sodium chloride 0.9 % 100 mL IVPB (0 g Intravenous Stopped 09/25/19 2048)  azithromycin (ZITHROMAX) 500 mg in sodium chloride 0.9 % 250 mL IVPB (0 mg Intravenous Stopped 09/25/19 2110)  fentaNYL (SUBLIMAZE) injection 25 mcg (has no administration in time range)  colchicine tablet 0.3 mg (has no  administration in time range)  lactated ringers bolus 1,000 mL (0 mLs Intravenous Stopped 09/25/19 2011)    And  lactated ringers bolus 1,000 mL (0 mLs Intravenous Stopped 09/25/19 2048)    And  lactated ringers bolus 500 mL (0 mLs Intravenous Stopped 09/25/19 2040)  ondansetron (ZOFRAN) injection 4 mg (4 mg Intravenous Given 09/25/19 1941)  morphine 4 MG/ML injection 4 mg (4 mg Intravenous Given 09/25/19 1942)     Initial Impression / Assessment and Plan / ED Course  I have reviewed the triage vital signs and the nursing notes.  Pertinent labs & imaging results that were available during my care of the patient were reviewed by me and considered in my medical decision making (see chart for details).        48 year old male with no significant medical history presents with concern for 3 days of  shortness of breath, cough, chest pain and low-grade fevers.  On arrival to the emergency department, heart rate is in the 130s, blood pressure is 88/62, respiratory rate in the 40s, and saturations 91% on room air.  He was placed on oxygen due to his respiratory effort.  Code sepsis was called given hypotension, tachycardia, and concern for possible community-acquired pneumonia on history.  Patient was given fluids, Rocephin and azithromycin.  Differential diagnosis continue to include pulmonary embolism, pneumonia, viral pneumonia including COVID-19, pericarditis, myocarditis, or other bacteremia.  Labs significant for hyponatremia, acute kidney injury with metabolic acidosis, lactic acidosis, hyperbilirubinemia, mild elevation in transaminases, leukocytosis with a white blood cell count of 23,000, mildly elevated lipase.  Due to patient's significant acute renal failure, we are unable to order CT with contrast, CT abdomen pelvis and CT chest without contrast were ordered for further evaluation of his shortness of breath, cough, atypical appearance on x-ray, and abdominal pain.  CT shows multifocal  patchy airspace opacities seen throughout both lungs which could be seen with acute atypical infection as well as multifocal metastatic disease, however no primary malignancy is identified.  He is also noted to have small bilateral pleural effusions, small pericardial effusion, mediastinal adenopathy, and no acute intra-abdominal or pelvic pathology.  EKG shows diffuse ST elevations, and given history most consistent with positional, pleuritic pain, in setting of acute viral illness, suspect pericarditis.  CT chest is also significant for small pericardial effusion which goes along with history and EKG changes consistent with this.  Initial troponin is 17, however repeat is 87.  Discussed with cardiologist on-call, who reviewed EKG, history from me and CT.  Recommends echo, repeat troponin, very low-dose colchicine 0.3 mg given AKI, steroids.  Suspect pericarditis and myopericarditis and happy to see patient tonight if hospitalist requests or if other concerning findings on ECHO, vitals or troponin.  COVID19 testing pending, on precautions.   BP 100s however heart rate continues to be elevated. Given CT, EKG findings, suspect atypical pneumonia with myopericarditis and sepsis. Given these findings, do not feel emergent VQ scan indicated for PE however inpt team will continue care for pt.      Final Clinical Impressions(s) / ED Diagnoses   Final diagnoses:  Acute infective pericarditis, unspecified infectious etiology  Sepsis with acute renal failure without septic shock, due to unspecified organism, unspecified acute renal failure type (HCC)  Atypical pneumonia  Acute myocarditis, unspecified myocarditis type  Hyponatremia  Hyperbilirubinemia  Lactic acidosis    ED Discharge Orders    None       Alvira Monday, MD 08/30/19 [phone]

## 2019-08-30 NOTE — ED Notes (Signed)
Pt yelling and moaning in pain. Dr. Baltazar Najjar notified, given orders for pain meds.

## 2019-08-30 NOTE — Progress Notes (Signed)
Chaplain responded to page from Mr. Lajeunesse's nurse for Advanced Directive paperwork.  Paperwork was given to and explained to Mr. Iodice fiance.  Because there are no witnesses or notary, Chaplain will have another chaplain follow-up first thing in the morning to complete Part C of Advanced Directive. Chaplain let Mr. Vanduyne fiance know that chaplains are also there to support her and Mr. Culotta.   Chaplain will follow-up.

## 2019-08-30 NOTE — Progress Notes (Signed)
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  BCID not completed at this time  Name of physician (or Provider) Contacted:  Dr. Karleen Hampshire  Current antibiotics: Vancomycin and Cefepime   Changes to prescribed antibiotics recommended:   No changes at this time.  F/U second set of blood cultures  No results found for this or any previous visit.  Caryl Pina 08/30/2019  7:23 AM

## 2019-08-30 NOTE — Progress Notes (Signed)
Pharmacy Antibiotic Note  Scott Strong is a 48 y.o. male admitted on 08/31/2019 with chest pain/SOB, possible PNA.  Pharmacy has been consulted for Vancomycin and Cefepime dosing.  Plan: Vancomycin 1500 mg IV now.  F/U renal fxn Cefepime 2 g IV q12h  Height: 5\' 8"  (172.7 cm) Weight: 180 lb (81.6 kg) IBW/kg (Calculated) : 68.4  Temp (24hrs), Avg:99.1 F (37.3 C), Min:98.6 F (37 C), Max:99.9 F (37.7 C)  Recent Labs  Lab 08/28/2019 1838 08/20/2019 2300 08/30/19 0249  WBC 23.8*  --  22.4*  CREATININE 2.70*  --  1.85*  LATICACIDVEN 2.8* 2.6* 3.2*    Estimated Creatinine Clearance: 47.2 mL/min (A) (by C-G formula based on SCr of 1.85 mg/dL (H)).    No Known Allergies  Caryl Pina 08/30/2019 5:37 AM

## 2019-08-30 NOTE — Progress Notes (Signed)
[place] Progress Note Patient Name: Scott Strong DOB: 05-30-1971 MRN: [id number]   Date of Service  08/30/2019  HPI/Events of Note  Hospitalist asked to admit patient presenting to the ED with history of IVDA involving Heroin, fever , tachypnea, and soft blood pressure. Patient has been on room air with sats of 93 %, blood pressure is currently 95/51 with a MAP of 61 off pressors after 2.8 liters of crystalloid. Hospitalist inquired about PCCM admission of the patient.  eICU Interventions  Pt does not meet criteria for ICU admit currently, he is appropriate for step down unit, Hospitalist advised to bolus patient with a liter of 5 % Albumin, and additional crystalloid as necessary and administer Vanc + Cefepime ASAP.        Kerry Kass Ogan 08/30/2019, 5:38 AM

## 2019-08-30 NOTE — ED Notes (Signed)
Assisted pt off bedside commode, cleaned pt and helped him into bed.

## 2019-08-30 NOTE — Significant Event (Signed)
Rapid Response Event Note  Overview: Sepsis - MEWS - "Second of Eyes"  Initial Focused Assessment: Called by nurse with concerns of low BPs, increased oxygen needs, and overall poor condition. I was with another patient and I came as soon as I could. Upon arrival, Scott Strong was tachypneic - RR in the upper 30s, mild shortness of breath but not in overt distress, he looks quite ill and fatigued, skin cold to touch, decreased capillary refill, fingertips/toes - bluish as well. + 1 pulses everywhere. HR 115s, SBP now in the low 100s, MAP 60-65. Getting MIVF NS 150 cc/hr, drops oxygen saturations with talking but recuperates. Drowsy but follow commands. Lung sounds - diminished in the bases. Blood sugar 171. TRH MD came to bedside.   Interventions: -- 2L NS Bolus (already received 2.5L LR Boluses and 1.5 NS Boluses).  -- STAT ABG -- STAT EKG -- STAT ECHO -- STAT LABS -- MRI Routine   Plan of Care: -- Given that the patient is still exhibiting signs of ongoing sepsis, LA is a little worse, increased RR/SOB, and his overall picture, I think the patient needs to be transferred to ICU for close monitoring and resuscitation.  -- PCCM and CARDS consulted, PCCM NP/MD came to the bedside, decision made to transfer patient to the MICU. -- I was called away to see another patient, RN will move to MICU when bed is pended.   Event Summary:  Call Time 1127 Arrival Time 1210 End Time 1344  Scott Strong R

## 2019-08-30 NOTE — Progress Notes (Signed)
Updated pt's fiancee via phone. He has designated her has his health care agent should he be unable to.   Pt does have children >48 yo but they are in the [place] and he does have parents in [place] who do not speak english.   She and he are working on finding his identification to possibly work on [employer]

## 2019-08-31 ENCOUNTER — Inpatient Hospital Stay (HOSPITAL_COMMUNITY): Payer: Medicaid Other

## 2019-08-31 DIAGNOSIS — J9600 Acute respiratory failure, unspecified whether with hypoxia or hypercapnia: Secondary | ICD-10-CM

## 2019-08-31 DIAGNOSIS — I301 Infective pericarditis: Secondary | ICD-10-CM

## 2019-08-31 DIAGNOSIS — J189 Pneumonia, unspecified organism: Secondary | ICD-10-CM

## 2019-08-31 DIAGNOSIS — R579 Shock, unspecified: Secondary | ICD-10-CM

## 2019-08-31 DIAGNOSIS — J9601 Acute respiratory failure with hypoxia: Secondary | ICD-10-CM

## 2019-08-31 LAB — POCT I-STAT 7, (LYTES, BLD GAS, ICA,H+H)
Acid-base deficit: 4 mmol/L — ABNORMAL HIGH (ref 0.0–2.0)
Acid-base deficit: 8 mmol/L — ABNORMAL HIGH (ref 0.0–2.0)
Acid-base deficit: 4 mmol/L — ABNORMAL HIGH (ref 0.0–2.0)
Bicarbonate: 17.8 mmol/L — ABNORMAL LOW (ref 20.0–28.0)
Bicarbonate: 20 mmol/L (ref 20.0–28.0)
Bicarbonate: 20.7 mmol/L (ref 20.0–28.0)
Calcium, Ion: 1.16 mmol/L (ref 1.15–1.40)
Calcium, Ion: 1.2 mmol/L (ref 1.15–1.40)
Calcium, Ion: 1.2 mmol/L (ref 1.15–1.40)
HCT: 34 % — ABNORMAL LOW (ref 39.0–52.0)
HCT: 39 % (ref 39.0–52.0)
HCT: 33 % — ABNORMAL LOW (ref 39.0–52.0)
Hemoglobin: 11.6 g/dL — ABNORMAL LOW (ref 13.0–17.0)
Hemoglobin: 13.3 g/dL (ref 13.0–17.0)
Hemoglobin: 11.2 g/dL — ABNORMAL LOW (ref 13.0–17.0)
O2 Saturation: 94 %
O2 Saturation: 96 %
O2 Saturation: 100 %
Patient temperature: 99.6
Patient temperature: 101.2
Patient temperature: 97.8
Potassium: 4.5 mmol/L (ref 3.5–5.1)
Potassium: 5.1 mmol/L (ref 3.5–5.1)
Potassium: 4.9 mmol/L (ref 3.5–5.1)
Sodium: 137 mmol/L (ref 135–145)
Sodium: 137 mmol/L (ref 135–145)
Sodium: 139 mmol/L (ref 135–145)
TCO2: 19 mmol/L — ABNORMAL LOW (ref 22–32)
TCO2: 22 mmol/L (ref 22–32)
TCO2: 22 mmol/L (ref 22–32)
pCO2 arterial: 23.5 mmHg — ABNORMAL LOW (ref 32.0–48.0)
pCO2 arterial: 54.9 mmHg — ABNORMAL HIGH (ref 32.0–48.0)
pCO2 arterial: 37 mmHg (ref 32.0–48.0)
pH, Arterial: 7.491 — ABNORMAL HIGH (ref 7.350–7.450)
pH, Arterial: 7.177 — CL (ref 7.350–7.450)
pH, Arterial: 7.354 (ref 7.350–7.450)
pO2, Arterial: 64 mmHg — ABNORMAL LOW (ref 83.0–108.0)
pO2, Arterial: 108 mmHg (ref 83.0–108.0)
pO2, Arterial: 204 mmHg — ABNORMAL HIGH (ref 83.0–108.0)

## 2019-08-31 LAB — PHOSPHORUS: Phosphorus: 4.7 mg/dL — ABNORMAL HIGH (ref 2.5–4.6)

## 2019-08-31 LAB — CBC
HCT: 35.9 % — ABNORMAL LOW (ref 39.0–52.0)
Hemoglobin: 12 g/dL — ABNORMAL LOW (ref 13.0–17.0)
MCH: 31.1 pg (ref 26.0–34.0)
MCHC: 33.4 g/dL (ref 30.0–36.0)
MCV: 93 fL (ref 80.0–100.0)
Platelets: 248 10*3/uL (ref 150–400)
RBC: 3.86 MIL/uL — ABNORMAL LOW (ref 4.22–5.81)
RDW: 14.2 % (ref 11.5–15.5)
WBC: 22.4 10*3/uL — ABNORMAL HIGH (ref 4.0–10.5)
nRBC: 0 % (ref 0.0–0.2)

## 2019-08-31 LAB — HEPATIC FUNCTION PANEL
ALT: 37 U/L (ref 0–44)
AST: 55 U/L — ABNORMAL HIGH (ref 15–41)
Albumin: 2.5 g/dL — ABNORMAL LOW (ref 3.5–5.0)
Alkaline Phosphatase: 79 U/L (ref 38–126)
Bilirubin, Direct: 2.4 mg/dL — ABNORMAL HIGH (ref 0.0–0.2)
Indirect Bilirubin: 1.5 mg/dL — ABNORMAL HIGH (ref 0.3–0.9)
Total Bilirubin: 3.9 mg/dL — ABNORMAL HIGH (ref 0.3–1.2)
Total Protein: 6 g/dL — ABNORMAL LOW (ref 6.5–8.1)

## 2019-08-31 LAB — BASIC METABOLIC PANEL
Anion gap: 11 (ref 5–15)
BUN: 37 mg/dL — ABNORMAL HIGH (ref 6–20)
CO2: 17 mmol/L — ABNORMAL LOW (ref 22–32)
Calcium: 8.2 mg/dL — ABNORMAL LOW (ref 8.9–10.3)
Chloride: 109 mmol/L (ref 98–111)
Creatinine, Ser: 1.6 mg/dL — ABNORMAL HIGH (ref 0.61–1.24)
GFR calc Af Amer: 58 mL/min — ABNORMAL LOW (ref >60)
GFR calc non Af Amer: 50 mL/min — ABNORMAL LOW (ref >60)
Glucose, Bld: 155 mg/dL — ABNORMAL HIGH (ref 70–99)
Potassium: 5.2 mmol/L — ABNORMAL HIGH (ref 3.5–5.1)
Sodium: 137 mmol/L (ref 135–145)

## 2019-08-31 LAB — GLUCOSE, CAPILLARY
Glucose-Capillary: 208 mg/dL — ABNORMAL HIGH (ref 70–99)
Glucose-Capillary: 167 mg/dL — ABNORMAL HIGH (ref 70–99)
Glucose-Capillary: 143 mg/dL — ABNORMAL HIGH (ref 70–99)
Glucose-Capillary: 217 mg/dL — ABNORMAL HIGH (ref 70–99)

## 2019-08-31 LAB — POTASSIUM: Potassium: 4.5 mmol/L (ref 3.5–5.1)

## 2019-08-31 LAB — LACTIC ACID, PLASMA
Lactic Acid, Venous: 2.2 mmol/L (ref 0.5–1.9)
Lactic Acid, Venous: 2 mmol/L (ref 0.5–1.9)

## 2019-08-31 LAB — TRIGLYCERIDES: Triglycerides: 148 mg/dL (ref <150)

## 2019-08-31 LAB — MAGNESIUM: Magnesium: 1.7 mg/dL (ref 1.7–2.4)

## 2019-08-31 MED ORDER — CHLORHEXIDINE GLUCONATE 0.12% ORAL RINSE (MEDLINE KIT)
15.0000 mL | Freq: Two times a day (BID) | OROMUCOSAL | Status: DC
  Administered 2019-08-31 – 2019-09-04 (×10): 15 mL via OROMUCOSAL

## 2019-08-31 MED ORDER — FENTANYL 2500MCG IN NS 250ML (10MCG/ML) PREMIX INFUSION
50.0000 ug/h | INTRAVENOUS | Status: DC
  Administered 2019-08-31: 02:00:00 50 ug/h via INTRAVENOUS
  Filled 2019-08-31: qty 250

## 2019-08-31 MED ORDER — QUETIAPINE FUMARATE 50 MG PO TABS
50.0000 mg | ORAL_TABLET | Freq: Two times a day (BID) | ORAL | Status: DC
  Administered 2019-08-31 – 2019-09-04 (×10): 50 mg
  Filled 2019-08-31 (×10): qty 1

## 2019-08-31 MED ORDER — FENTANYL BOLUS VIA INFUSION
50.0000 ug | INTRAVENOUS | Status: DC | PRN
  Filled 2019-08-31: qty 50

## 2019-08-31 MED ORDER — ORAL CARE MOUTH RINSE
15.0000 mL | OROMUCOSAL | Status: DC
  Administered 2019-08-31 – 2019-09-04 (×42): 15 mL via OROMUCOSAL

## 2019-08-31 MED ORDER — PROPOFOL 1000 MG/100ML IV EMUL
0.0000 ug/kg/min | INTRAVENOUS | Status: DC
  Administered 2019-08-31: 35 ug/kg/min via INTRAVENOUS
  Administered 2019-08-31: 50 ug/kg/min via INTRAVENOUS
  Administered 2019-08-31: 15:00:00 35 ug/kg/min via INTRAVENOUS
  Administered 2019-08-31: 50 ug/kg/min via INTRAVENOUS
  Administered 2019-08-31 – 2019-09-01 (×2): 30 ug/kg/min via INTRAVENOUS
  Administered 2019-09-01 (×2): 50 ug/kg/min via INTRAVENOUS
  Administered 2019-09-01 (×2): 40 ug/kg/min via INTRAVENOUS
  Administered 2019-09-02 (×2): 50 ug/kg/min via INTRAVENOUS
  Administered 2019-09-02: 40 ug/kg/min via INTRAVENOUS
  Administered 2019-09-02: 18:00:00 30 ug/kg/min via INTRAVENOUS
  Administered 2019-09-03: 10 ug/kg/min via INTRAVENOUS
  Filled 2019-08-31 (×15): qty 100

## 2019-08-31 MED ORDER — HYDROCORTISONE NA SUCCINATE PF 100 MG IJ SOLR
50.0000 mg | Freq: Four times a day (QID) | INTRAMUSCULAR | Status: DC
  Administered 2019-08-31 – 2019-09-03 (×14): 50 mg via INTRAVENOUS
  Filled 2019-08-31 (×14): qty 2

## 2019-08-31 MED ORDER — OXYCODONE HCL 5 MG/5ML PO SOLN: 10.0000 mg | ORAL | Status: DC | PRN

## 2019-08-31 MED ORDER — FENTANYL CITRATE (PF) 100 MCG/2ML IJ SOLN
100.0000 ug | Freq: Once | INTRAMUSCULAR | Status: AC
  Administered 2019-08-31: 01:00:00 100 ug via INTRAVENOUS

## 2019-08-31 MED ORDER — FENTANYL BOLUS VIA INFUSION
50.0000 ug | INTRAVENOUS | Status: DC | PRN
  Administered 2019-08-31 – 2019-09-04 (×23): 50 ug via INTRAVENOUS
  Filled 2019-08-31: qty 50

## 2019-08-31 MED ORDER — DEXMEDETOMIDINE HCL IN NACL 400 MCG/100ML IV SOLN
0.4000 ug/kg/h | INTRAVENOUS | Status: DC
  Administered 2019-08-31: 03:00:00 0.6 ug/kg/h via INTRAVENOUS

## 2019-08-31 MED ORDER — MIDAZOLAM HCL 2 MG/2ML IJ SOLN
2.0000 mg | INTRAMUSCULAR | Status: DC | PRN
  Filled 2019-08-31: qty 2

## 2019-08-31 MED ORDER — ALBUMIN HUMAN 25 % IV SOLN
25.0000 g | Freq: Once | INTRAVENOUS | Status: AC
  Administered 2019-08-31: 01:00:00 25 g via INTRAVENOUS
  Filled 2019-08-31: qty 100

## 2019-08-31 MED ORDER — MIDAZOLAM HCL 2 MG/2ML IJ SOLN
INTRAMUSCULAR | Status: AC
  Administered 2019-08-31: 01:00:00 2 mg
  Filled 2019-08-31: qty 2

## 2019-08-31 MED ORDER — FENTANYL CITRATE (PF) 100 MCG/2ML IJ SOLN
INTRAMUSCULAR | Status: AC
  Administered 2019-08-31: 01:00:00 100 ug via INTRAVENOUS
  Filled 2019-08-31: qty 2

## 2019-08-31 MED ORDER — FENTANYL CITRATE (PF) 100 MCG/2ML IJ SOLN: 50.0000 ug | Freq: Once | INTRAMUSCULAR | Status: DC

## 2019-08-31 MED ORDER — LACTATED RINGERS IV BOLUS
500.0000 mL | Freq: Once | INTRAVENOUS | Status: AC
  Administered 2019-08-31: 500 mL via INTRAVENOUS

## 2019-08-31 MED ORDER — IPRATROPIUM-ALBUTEROL 0.5-2.5 (3) MG/3ML IN SOLN
3.0000 mL | Freq: Four times a day (QID) | RESPIRATORY_TRACT | Status: DC
  Administered 2019-08-31 – 2019-09-04 (×18): 3 mL via RESPIRATORY_TRACT
  Filled 2019-08-31 (×17): qty 3

## 2019-08-31 MED ORDER — FENTANYL 2500MCG IN NS 250ML (10MCG/ML) PREMIX INFUSION
50.0000 ug/h | INTRAVENOUS | Status: DC
  Administered 2019-08-31 – 2019-09-02 (×5): 200 ug/h via INTRAVENOUS
  Administered 2019-09-02: 12:00:00 300 ug/h via INTRAVENOUS
  Administered 2019-09-03 (×2): 250 ug/h via INTRAVENOUS
  Administered 2019-09-03: 300 ug/h via INTRAVENOUS
  Administered 2019-09-04: 250 ug/h via INTRAVENOUS
  Filled 2019-08-31 (×10): qty 250

## 2019-08-31 MED ORDER — LACTATED RINGERS IV SOLN
INTRAVENOUS | Status: DC
  Administered 2019-08-31 – 2019-09-01 (×4): via INTRAVENOUS

## 2019-08-31 MED ORDER — MIDAZOLAM HCL 2 MG/2ML IJ SOLN
2.0000 mg | INTRAMUSCULAR | Status: AC | PRN
  Administered 2019-08-31 – 2019-09-03 (×3): 2 mg via INTRAVENOUS
  Filled 2019-08-31 (×6): qty 2

## 2019-08-31 MED ORDER — VANCOMYCIN HCL 10 G IV SOLR
1500.0000 mg | INTRAVENOUS | Status: DC
  Administered 2019-09-01 – 2019-09-02 (×2): 1500 mg via INTRAVENOUS
  Filled 2019-08-31 (×3): qty 1500

## 2019-08-31 MED ORDER — FENTANYL CITRATE (PF) 100 MCG/2ML IJ SOLN: 50.0000 ug | Freq: Once | INTRAMUSCULAR | Status: AC

## 2019-08-31 MED ORDER — FENTANYL CITRATE (PF) 100 MCG/2ML IJ SOLN
50.0000 ug | INTRAMUSCULAR | Status: DC | PRN
  Filled 2019-08-31: qty 2
  Filled 2019-08-31: qty 4

## 2019-08-31 MED ORDER — NOREPINEPHRINE 4 MG/250ML-% IV SOLN
0.0000 ug/min | INTRAVENOUS | Status: DC
  Administered 2019-08-31: 10 ug/min via INTRAVENOUS
  Administered 2019-08-31: 14:00:00 6 ug/min via INTRAVENOUS
  Administered 2019-08-31: 03:00:00 5 ug/min via INTRAVENOUS
  Administered 2019-09-01: 5.5 ug/min via INTRAVENOUS
  Administered 2019-09-01: 4 ug/min via INTRAVENOUS
  Filled 2019-08-31 (×4): qty 250

## 2019-08-31 MED ORDER — FENTANYL CITRATE (PF) 100 MCG/2ML IJ SOLN
100.0000 ug | Freq: Once | INTRAMUSCULAR | Status: AC
  Administered 2019-08-31: 100 ug via INTRAVENOUS

## 2019-08-31 MED ORDER — DOCUSATE SODIUM 50 MG/5ML PO LIQD: 100.0000 mg | Freq: Two times a day (BID) | ORAL | Status: DC | PRN

## 2019-08-31 MED ORDER — MAGNESIUM SULFATE IN D5W 1-5 GM/100ML-% IV SOLN
1.0000 g | Freq: Once | INTRAVENOUS | Status: AC
  Administered 2019-08-31: 1 g via INTRAVENOUS
  Filled 2019-08-31: qty 100

## 2019-08-31 MED ORDER — VITAL HIGH PROTEIN PO LIQD
1000.0000 mL | ORAL | Status: DC
  Administered 2019-08-31 – 2019-09-04 (×7): 1000 mL

## 2019-08-31 MED ORDER — PROPOFOL 1000 MG/100ML IV EMUL
INTRAVENOUS | Status: AC
  Administered 2019-08-31: 30 ug/kg/min via INTRAVENOUS
  Filled 2019-08-31: qty 100

## 2019-08-31 MED ORDER — IPRATROPIUM-ALBUTEROL 0.5-2.5 (3) MG/3ML IN SOLN
RESPIRATORY_TRACT | Status: AC
  Administered 2019-08-31: 3 mL via RESPIRATORY_TRACT
  Filled 2019-08-31: qty 3

## 2019-08-31 MED ORDER — ALBUMIN HUMAN 5 % IV SOLN
25.0000 g | Freq: Once | INTRAVENOUS | Status: AC
  Administered 2019-08-31: 05:00:00 25 g via INTRAVENOUS
  Filled 2019-08-31: qty 500

## 2019-08-31 MED ORDER — PANTOPRAZOLE SODIUM 40 MG IV SOLR
40.0000 mg | Freq: Every day | INTRAVENOUS | Status: DC
  Administered 2019-08-31 – 2019-09-02 (×3): 40 mg via INTRAVENOUS
  Filled 2019-08-31 (×3): qty 40

## 2019-08-31 MED ORDER — MIDAZOLAM HCL 2 MG/2ML IJ SOLN: 2.0000 mg | Freq: Once | INTRAMUSCULAR | Status: DC

## 2019-08-31 MED ORDER — MIDAZOLAM HCL 2 MG/2ML IJ SOLN
4.0000 mg | Freq: Once | INTRAMUSCULAR | Status: AC
  Administered 2019-08-31: 02:00:00 4 mg via INTRAVENOUS

## 2019-08-31 MED ORDER — ETOMIDATE 2 MG/ML IV SOLN
20.0000 mg | Freq: Once | INTRAVENOUS | Status: AC
  Administered 2019-08-31: 20 mg via INTRAVENOUS

## 2019-08-31 MED ORDER — CLONAZEPAM 1 MG PO TABS
1.0000 mg | ORAL_TABLET | Freq: Two times a day (BID) | ORAL | Status: DC
  Administered 2019-08-31 – 2019-09-01 (×5): 1 mg
  Filled 2019-08-31 (×5): qty 1

## 2019-08-31 MED ORDER — MIDAZOLAM HCL 2 MG/2ML IJ SOLN
2.0000 mg | INTRAMUSCULAR | Status: DC | PRN
  Administered 2019-08-31 – 2019-09-03 (×10): 2 mg via INTRAVENOUS
  Filled 2019-08-31 (×5): qty 2

## 2019-08-31 MED ORDER — MIDAZOLAM HCL 2 MG/2ML IJ SOLN
2.0000 mg | INTRAMUSCULAR | Status: DC | PRN
  Administered 2019-08-31 (×2): 2 mg via INTRAVENOUS
  Filled 2019-08-31 (×3): qty 2

## 2019-08-31 MED ORDER — FENTANYL CITRATE (PF) 100 MCG/2ML IJ SOLN: 50.0000 ug | INTRAMUSCULAR | Status: DC | PRN

## 2019-08-31 MED ORDER — ROCURONIUM BROMIDE 50 MG/5ML IV SOLN
100.0000 mg | Freq: Once | INTRAVENOUS | Status: AC
  Administered 2019-08-31: 01:00:00 100 mg via INTRAVENOUS
  Filled 2019-08-31: qty 10

## 2019-08-31 NOTE — Progress Notes (Signed)
eLink Physician-Brief Progress Note Patient Name: Scott Strong DOB: 09-01-1971 MRN: [id number]   Date of Service  08/31/2019  HPI/Events of Note  Septic shock, Acute Respiratory failure s/p intubation, respiratory alkalosis  eICU Interventions  Albumin 5 % 500 ml iv bolus x 1, Lactated Ringers 500 ml iv bolus x 1, reduce respiratory rate to 14, ABG in 2 hours.        Elidia Bonenfant U Aarya Quebedeaux 08/31/2019, 1:50 AM

## 2019-08-31 NOTE — Progress Notes (Signed)
Chaplain was following up on a request for an advanced directive, but the patient is not able to communicate.  Brion Aliment Chaplain Resident For questions concerning this note please contact me by pager [phone]

## 2019-08-31 NOTE — Progress Notes (Signed)
eLink Physician-Brief Progress Note Patient Name: Scott Strong DOB: [birth year] MRN: [id number]   Date of Service  08/31/2019  HPI/Events of Note  Pt with MRSA bacteremia, septic shock, septic emboli to the lungs, and encephalopathy.  Respiratory status is marginal and he has significant work of breathing. SBP 76/48.  eICU Interventions  Norepinephrine infusion ordered via piv, Albumin 25 % 25 gm iv bolus x 1, PCCM on the ground asked to evaluate him for possible intubation to allow more aggressive volume resuscitation for his septic shock.        Kerry Kass Ogan 08/31/2019, 12:49 AM

## 2019-08-31 NOTE — Progress Notes (Signed)
Initial Nutrition Assessment  DOCUMENTATION CODES:   Not applicable  INTERVENTION:    Vital High Protein at 70 ml/h (1680 ml per day)   Provides 1680 kcal (2158 kcal total including kcal from Propofol), 147 gm protein, 1404 ml free water daily  NUTRITION DIAGNOSIS:   Inadequate oral intake related to inability to eat as evidenced by NPO status.  GOAL:   Patient will meet greater than or equal to 90% of their needs  MONITOR:   Vent status, TF tolerance, Labs  REASON FOR ASSESSMENT:   Ventilator, Consult Enteral/tube feeding initiation and management  ASSESSMENT:   48 yo male admitted with sepsis, MRSA bacteremia. Developed increased WOB, found to have septic emboli and small pericardial effusion. Required transfer to the ICU and intubation on 10/23. PMH includes IV drug abuse, daily heroin use.   COVID negative on admission. Bilateral MRSA PNA with bilateral pleural effusions. Acute renal failure with hypomagnesemia and hyperkalemia. Magnesium being replaced.  Received MD Consult for TF initiation and management. OG tube in place.  Patient is currently intubated on ventilator support requiring one pressor. MV: 13.4 L/min Temp (24hrs), Avg:98.9 F (37.2 C), Min:97.6 F (36.4 C), Max:101.2 F (38.4 C)  Propofol: 18.1 ml/hr providing 478 kcal from lipid.  Labs reviewed. Potassium 5.2 (H), BUN 37 (H), creatinine 1.6 (H), phosphorus 4.7 (H) CBG's: 171-130  Medications reviewed and include solu-cortef, mag sulfate, levophed, propofol.   NUTRITION - FOCUSED PHYSICAL EXAM:  deferred  Diet Order:   Diet Order            Diet NPO time specified  Diet effective now              EDUCATION NEEDS:   Not appropriate for education at this time  Skin:  Skin Assessment: Reviewed RN Assessment  Last BM:  10/22 type 1  Height:   Ht Readings from Last 1 Encounters:  08/30/19 5\' 8"  (1.727 m)    Weight:   Wt Readings from Last 1 Encounters:  08/30/19  86.2 kg    Ideal Body Weight:  70 kg  BMI:  Body mass index is 28.89 kg/m.  Estimated Nutritional Needs:   Kcal:  2260  Protein:  130-150 gm  Fluid:  >/= 2.3 L    Molli Barrows, RD, LDN, [place] Pager [phone] After Hours Pager [phone]

## 2019-08-31 NOTE — Procedures (Signed)
Intubation Procedure Note Scott Strong [id number] 04/20/1971  Procedure: Intubation Indications: Respiratory insufficiency  Procedure Details Consent: Risks of procedure as well as the alternatives and risks of each were explained to the (patient/caregiver).  Consent for procedure obtained. I explained to the patient prior to intubation given his work of breathing, imaging and recent ABG>>early intubation was advised Time Out: Verified patient identification, verified procedure, site/side was marked, verified correct patient position, special equipment/implants available, medications/allergies/relevent history reviewed, required imaging and test results available.  Performed  Maximum sterile technique was used including antiseptics, cap, gloves, gown, hand hygiene and mask.  MAC and 4 ETT 7.5 at 25 Passively oxygenated with HFNC at 15L   RSI:  25m Versed x 4= 8 mg total 20 Etomidate 100 mg Fentanyl Post visualizing cords: 100 Rocuronium Restarted on Precedex at 1.2   Evaluation Hemodynamic Status: BP stable throughout; O2 sats: transiently fell during during procedure Patient's Current Condition: stable Complications: No apparent complications Patient did tolerate procedure well. Chest X-ray ordered to verify placement.  CXR: tube position acceptable.   KRise PaganiniScatliffe 08/31/2019

## 2019-08-31 NOTE — Progress Notes (Signed)
[facility] Progress Note Patient Name: Scott Strong DOB: [birth year] MRN: [id number]   Date of Service  08/31/2019  HPI/Events of Note  Pt intubated and needs wrist restraint orders.  eICU Interventions  Soft wrist restraint orders placed        Malgorzata Albert U Addelyn Alleman 08/31/2019, 2:46 AM

## 2019-08-31 NOTE — Progress Notes (Addendum)
Progress Note  Patient Name: Scott Strong Date of Encounter: 08/31/2019  Primary Cardiologist: Tobias Alexander, MD   Subjective   Pt now intubated.   Inpatient Medications    Scheduled Meds:  Chlorhexidine Gluconate Cloth  6 each Topical Daily   Chlorhexidine Gluconate Cloth  6 each Topical Q0600   clonazePAM  1 mg Per Tube Q12H   colchicine  0.3 mg Oral BID   colchicine  0.6 mg Oral BID   hydrocortisone sod succinate (SOLU-CORTEF) inj  50 mg Intravenous Q6H   mouth rinse  15 mL Mouth Rinse BID   mupirocin ointment  1 application Nasal BID   pantoprazole (PROTONIX) IV  40 mg Intravenous Daily   QUEtiapine  50 mg Per Tube Q12H   Continuous Infusions:  fentaNYL infusion INTRAVENOUS 200 mcg/hr (08/31/19 0640)   lactated ringers 125 mL/hr at 08/31/19 0137   magnesium sulfate bolus IVPB     norepinephrine (LEVOPHED) Adult infusion 7 mcg/min (08/31/19 0640)   propofol (DIPRIVAN) infusion 30 mcg/kg/min (08/31/19 0640)   vancomycin     PRN Meds: acetaminophen **OR** acetaminophen, docusate, fentaNYL, metoprolol tartrate, midazolam, midazolam, oxyCODONE   Vital Signs    Vitals:   08/31/19 0600 08/31/19 0615 08/31/19 0630 08/31/19 0700  BP: 100/76 100/73 101/73 101/76  Pulse: 67   64  Resp: (!) 31   (!) 30  Temp:    97.6 F (36.4 C)  TempSrc:    Oral  SpO2: 98%   99%  Weight:      Height:        Intake/Output Summary (Last 24 hours) at 08/31/2019 0800 Last data filed at 08/31/2019 0640 Gross per 24 hour  Intake 1708.46 ml  Output 1475 ml  Net 233.46 ml   Last 3 Weights 08/30/2019 08/14/2019  Weight (lbs) 190 lb 0.6 oz 180 lb  Weight (kg) 86.2 kg 81.647 kg      Telemetry    Sinus tachycardia after intubation, now sinus rhythm HR in the 70s - Personally Reviewed  ECG    No new tracings - Personally Reviewed  Physical Exam   GEN: intubated and sedated Neck: No JVD, exam difficult Cardiac: RRR Respiratory: intubated MS: No  edema Neuro:  sedated  Psych: sedated   Labs    High Sensitivity Troponin:   Recent Labs  Lab 08/10/2019 1838 08/15/2019 2300 08/30/19 0249 08/30/19 0427 08/30/19 0727  TROPONINIHS 17 87* 79* 101* 71*      Chemistry Recent Labs  Lab 08/30/19 0249  08/30/19 1229  08/31/19 0254 08/31/19 0512 08/31/19 0527  NA 130*   < > 134*   < > 137 139 137  K 5.1   < > 4.5   < > 5.1 4.9 5.2*  CL 95*  --  103  --   --   --  109  CO2 20*  --  18*  --   --   --  17*  GLUCOSE 102*  --  169*  --   --   --  155*  BUN 35*  --  33*  --   --   --  37*  CREATININE 1.85*  --  1.59*  --   --   --  1.60*  CALCIUM 8.5*  --  8.1*  --   --   --  8.2*  PROT 7.2  --  6.4*  --   --   --  6.0*  ALBUMIN 2.2*  --  2.1*  --   --   --  2.5*  AST 68*  --  67*  --   --   --  55*  ALT 57*  --  51*  --   --   --  37  ALKPHOS 111  --  94  --   --   --  79  BILITOT 4.8*  --  4.4*  --   --   --  3.9*  GFRNONAA 42*  --  51*  --   --   --  50*  GFRAA 49*  --  59*  --   --   --  58*  ANIONGAP 15  --  13  --   --   --  11   < > = values in this interval not displayed.     Hematology Recent Labs  Lab 08/19/2019 1838 08/30/19 0249  08/31/19 0254 08/31/19 0512 08/31/19 0527  WBC 23.8* 22.4*  --   --   --  22.4*  RBC 4.84 4.77  --   --   --  3.86*  HGB 15.1 15.1   < > 13.3 11.2* 12.0*  HCT 43.8 42.9   < > 39.0 33.0* 35.9*  MCV 90.5 89.9  --   --   --  93.0  MCH 31.2 31.7  --   --   --  31.1  MCHC 34.5 35.2  --   --   --  33.4  RDW 13.3 13.4  --   --   --  14.2  PLT 281 247  --   --   --  248   < > = values in this interval not displayed.    BNPNo results for input(s): BNP, PROBNP in the last 168 hours.   DDimer  Recent Labs  Lab 08/30/19 0249  DDIMER 9.44*     Radiology    Ct Abdomen Pelvis Wo Contrast  Result Date: 08/26/2019 CLINICAL DATA:  Abdominal pain and shortness of breath EXAM: CT ABDOMEN AND PELVIS WITHOUT CONTRAST TECHNIQUE: Multidetector CT imaging of the abdomen and pelvis was  performed following the standard protocol without IV contrast. COMPARISON:  None. FINDINGS: Cardiovascular: There is a small pericardial effusion. Coronary artery calcifications are seen. Scattered mild atherosclerosis is noted. 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 Mediastinum/Nodes: Limited due to the lack of intravenous contrast, however there does appear to be scattered subcarinal and prevascular lymph nodes seen. The thyroid gland, trachea and esophagus demonstrate no significant findings. Lungs/Pleura: There is multifocal extensive patchy airspace opacities seen throughout both lungs. There is also mild increased interstitial thickening at the lung bases. A small to moderate right and trace left pleural effusion is seen. Upper abdomen: The visualized portion of the upper abdomen is unremarkable. Musculoskeletal/Chest wall: There is no chest wall mass or suspicious osseous finding. No acute osseous abnormality Lower chest: The visualized heart size within normal limits. No pericardial fluid/thickening. No hiatal hernia. The visualized portions of the lungs are clear. Hepatobiliary: Although limited due to the lack of intravenous contrast, normal in appearance without gross focal abnormality. No evidence of calcified gallstones or biliary ductal dilatation. Pancreas:  Unremarkable.  No surrounding inflammatory changes. Spleen: Normal in size. Although limited due to the lack of intravenous contrast, normal in appearance. Adrenals/Urinary Tract: Both adrenal glands appear normal. The kidneys and collecting system appear normal without evidence of urinary tract calculus or hydronephrosis. Bladder is unremarkable. Stomach/Bowel: The stomach, small bowel, and colon are normal in appearance. No inflammatory changes or obstructive findings. appendix  is normal. Vascular/Lymphatic: There are no enlarged abdominal or pelvic lymph nodes. Scattered aortic atherosclerotic calcifications are seen  without aneurysmal dilatation. Reproductive: The prostate is unremarkable. Other: No evidence of abdominal wall mass or hernia. Musculoskeletal: No acute or significant osseous findings. IMPRESSION: Multifocal patchy airspace opacities seen throughout both lungs. There are a spectrum of findings in the lungs which can be seen with acute atypical infection, as well as multifocal metastatic disease (no definite primary malignancy identified). Small to moderate right and trace left pleural effusion. Mediastinal adenopathy, likely reactive No acute intra-abdominal or pelvic pathology. Aortic Atherosclerosis (ICD10-I70.0). Electronically Signed   By: Prudencio Pair M.D.   On: 28-Sep-2019 22:46   Ct Chest Wo Contrast  Result Date: 2019-09-28 CLINICAL DATA:  Abdominal pain and shortness of breath EXAM: CT ABDOMEN AND PELVIS WITHOUT CONTRAST TECHNIQUE: Multidetector CT imaging of the abdomen and pelvis was performed following the standard protocol without IV contrast. COMPARISON:  None. FINDINGS: Cardiovascular: There is a small pericardial effusion. Coronary artery calcifications are seen. Scattered mild atherosclerosis is noted. 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 Mediastinum/Nodes: Limited due to the lack of intravenous contrast, however there does appear to be scattered subcarinal and prevascular lymph nodes seen. The thyroid gland, trachea and esophagus demonstrate no significant findings. Lungs/Pleura: There is multifocal extensive patchy airspace opacities seen throughout both lungs. There is also mild increased interstitial thickening at the lung bases. A small to moderate right and trace left pleural effusion is seen. Upper abdomen: The visualized portion of the upper abdomen is unremarkable. Musculoskeletal/Chest wall: There is no chest wall mass or suspicious osseous finding. No acute osseous abnormality Lower chest: The visualized heart size within normal limits. No pericardial  fluid/thickening. No hiatal hernia. The visualized portions of the lungs are clear. Hepatobiliary: Although limited due to the lack of intravenous contrast, normal in appearance without gross focal abnormality. No evidence of calcified gallstones or biliary ductal dilatation. Pancreas:  Unremarkable.  No surrounding inflammatory changes. Spleen: Normal in size. Although limited due to the lack of intravenous contrast, normal in appearance. Adrenals/Urinary Tract: Both adrenal glands appear normal. The kidneys and collecting system appear normal without evidence of urinary tract calculus or hydronephrosis. Bladder is unremarkable. Stomach/Bowel: The stomach, small bowel, and colon are normal in appearance. No inflammatory changes or obstructive findings. appendix is normal. Vascular/Lymphatic: There are no enlarged abdominal or pelvic lymph nodes. Scattered aortic atherosclerotic calcifications are seen without aneurysmal dilatation. Reproductive: The prostate is unremarkable. Other: No evidence of abdominal wall mass or hernia. Musculoskeletal: No acute or significant osseous findings. IMPRESSION: Multifocal patchy airspace opacities seen throughout both lungs. There are a spectrum of findings in the lungs which can be seen with acute atypical infection, as well as multifocal metastatic disease (no definite primary malignancy identified). Small to moderate right and trace left pleural effusion. Mediastinal adenopathy, likely reactive No acute intra-abdominal or pelvic pathology. Aortic Atherosclerosis (ICD10-I70.0). Electronically Signed   By: Prudencio Pair M.D.   On: 2019/09/28 22:46   Ct Angio Chest Pe W Or Wo Contrast  Result Date: 08/30/2019 CLINICAL DATA:  Intermediate probability for pulmonary embolism. Positive D-dimer EXAM: CT ANGIOGRAPHY CHEST WITH CONTRAST TECHNIQUE: Multidetector CT imaging of the chest was performed using the standard protocol during bolus administration of intravenous contrast.  Multiplanar CT image reconstructions and MIPs were obtained to evaluate the vascular anatomy. CONTRAST:  42mL OMNIPAQUE IOHEXOL 350 MG/ML SOLN COMPARISON:  Noncontrast CT from yesterday FINDINGS: Cardiovascular: Normal heart size. Small pericardial effusion.  There is likely smooth serosal enhancement, certainty limited by contrast timing. Patient has history of infective endocarditis per the chart. Negative aorta. No pulmonary embolism is seen. Coronary atherosclerosis that is notable for age. Mediastinum/Nodes: Negative for adenopathy or collection. Lungs/Pleura: Irregularly marginated rounded nodules and consolidative opacities in the bilateral lungs with areas of central cavitation that are non drainable. Bilateral small pleural effusion with scalloping best seen on the right, progressed. No pneumothorax. Upper Abdomen: Negative Musculoskeletal: No evidence of spinal infection. Review of the MIP images confirms the above findings. IMPRESSION: 1. Multifocal cavitary pneumonia consistent with septic emboli. No emboli large enough for visualization by CTA. 2. Small pleural effusions that are complex and increased from yesterday, right larger than left. 3. Small pericardial effusion with possible pericarditis. Electronically Signed   By: Marnee SpringJonathon  Watts M.D.   On: 08/30/2019 06:38   Dg Chest Port 1 View  Result Date: 08/31/2019 CLINICAL DATA:  Intubation. EXAM: PORTABLE CHEST 1 VIEW COMPARISON:  Radiograph and CT yesterday FINDINGS: Endotracheal tube tip 4.1 cm from the carina. Enteric tube tip and side-port below the diaphragm in the stomach. Heterogeneous bilateral pulmonary opacities, cavitary components on CT are not well demonstrated radiographically. Bilateral pleural effusions. Mild enlargement of the cardiopericardial silhouette, small pericardial effusion on CT. No pneumothorax. IMPRESSION: 1. Endotracheal tube tip 4.1 cm from the carina. Enteric tube tip and side-port below the diaphragm in the  stomach. 2. Multifocal pneumonia and bilateral pleural effusions, not significantly changed from yesterday. Electronically Signed   By: Narda RutherfordMelanie  Sanford M.D.   On: 08/31/2019 01:59   Dg Chest Port 1 View  Result Date: 08/30/2019 CLINICAL DATA:  Respiratory failure EXAM: PORTABLE CHEST 1 VIEW COMPARISON:  08/10/2019, CT chest, 08/30/2019, 6:14 a.m. FINDINGS: No significant interval change in extensive bilateral heterogeneous and nodular airspace opacities with small, layering bilateral pleural effusions. Enlargement of the cardiac silhouette, in keeping with pericardial effusion identified by prior CT. IMPRESSION: 1. No significant interval change in extensive bilateral heterogeneous and nodular airspace opacities with small, layering bilateral pleural effusions. 2. Enlargement of the cardiac silhouette, in keeping with pericardial effusion identified by prior CT. Electronically Signed   By: Lauralyn PrimesAlex  Bibbey M.D.   On: 08/30/2019 16:42   Dg Chest Port 1 View  Result Date: 08/22/2019 CLINICAL DATA:  Right-sided chest pain and shortness of breath for 3 days. Diaphoresis. EXAM: PORTABLE CHEST 1 VIEW COMPARISON:  None. FINDINGS: Heart size is within normal limits allowing for low lung volumes. Bibasilar atelectasis noted. Multifocal ill-defined nodular opacities are seen in both lungs which could be infectious, inflammatory, or neoplastic in etiology. IMPRESSION: Low lung volumes with ill-defined bilateral nodular opacities, which could be infectious, inflammatory or neoplastic in etiology. Recommend further evaluation with two-view chest at full inspiration, or chest CT. Electronically Signed   By: Danae OrleansJohn A Stahl M.D.   On: 08/30/2019 19:39   [place] Abdomen Limited Ruq  Result Date: 08/30/2019 CLINICAL DATA:  Elevated LFTs EXAM: ULTRASOUND ABDOMEN LIMITED RIGHT UPPER QUADRANT COMPARISON:  None. FINDINGS: Gallbladder: No gallstones or wall thickening visualized. No sonographic Murphy sign noted by sonographer.  Common bile duct: Diameter: 3.1 mm. Liver: No focal lesion identified. Within normal limits in parenchymal echogenicity. Portal vein is patent on color Doppler imaging with normal direction of blood flow towards the liver. Other: Small right pleural effusion is noted similar to that seen on prior CT examination. IMPRESSION: Unremarkable right upper quadrant ultrasound. Right pleural effusion similar to that seen on prior exam. Electronically Signed  By: Alcide Clever M.D.   On: 08/30/2019 08:55    Cardiac Studies   Echo 08/30/19: 1. Left ventricular ejection fraction, by visual estimation, is 60 to 65%. The left ventricle has normal function. There is no left ventricular hypertrophy.  2. Global right ventricle has normal systolic function.The right ventricular size is normal. No increase in right ventricular wall thickness.  3. Left atrial size was normal.  4. Right atrial size was normal.  5. The mitral valve is grossly normal. Trace mitral valve regurgitation.  6. The tricuspid valve is grossly normal. Tricuspid valve regurgitation is trivial.  7. The aortic valve is tricuspid Aortic valve regurgitation was not visualized by color flow Doppler.  8. The pulmonic valve was grossly normal. Pulmonic valve regurgitation is not visualized by color flow Doppler.  9. Normal pulmonary artery systolic pressure. 10. The inferior vena cava is dilated in size with >50% respiratory variability, suggesting right atrial pressure of 8 mmHg. The IVC collapses. 11. Small pericardial effusion. 12. The pericardial effusion is circumferential. Tamponade is not suspected. 13. No evidence of any gross valvular vegetations.   Patient Profile     48 y.o. male no prior medical history, but daily IV heroin use. Intubated overnight, sepsis picture  Assessment & Plan    1. Elevated troponin 2. ST elevation throughout precordial leads - suspect viral pericarditis - started colchicine yesterday - chest pain was  pleuritic in nature and likely related to PNA,  septic emboli, and pericarditis - troponin trend has been flat and thought to be inconsistent with ACS process - no plans for ischemic evaluation at this time  3. Pericardial effusion - images reviewed with Dr. Delton See - pericardial effusion on echocardiogram described as small and circumferential without tamponade physiology - no plans for intervention  4. Sepsis - COVID negative - MSSA PNA - septic emboli with cavitary lesions - staph bacteremia - rhinovirus/enterovirus positive - hx of daily IV heroin use - was intubated for worsening clinical status this morning  5. AKI - sCr stable at 1.60  For questions or updates, please contact [facility] Please consult [email or web address] for contact info under     Signed, Marcelino Duster, PA  08/31/2019, 8:00 AM    The patient was seen, examined and discussed with Duane Boston , PA-C and I agree with the above.   48 y.o. male with a hx of IV drug use (heroin daily) who is being seen today for the evaluation of CP and elevated troponin 17 --> 87 --> 79 --> 101 --> 71. No prior medical history, no cardiac history. He tested negative for COVID-19 infection.  His CRP severely elevated.  He got intubated overnight, he remains hypotensive on support with Levophed, creatinine remains elevated but stable at 1.6, slightly improved, lactic acid down to 2.2 from 3.7. He CTA showed multifocal cavitary pneumonia S consistent with septic emboli.  Echocardiogram showed normal LVEF 60 to 65%, mild circumferential pericardial effusion.  EKG shows diffuse concave ST elevation consistent with pericardial effusion, continue colchicine and supportive care for underlying infection. At this point no ischemic evaluation is indicated, he has minimally elevated troponin with flat trend consistent with pericarditis, we will add colchicine to his regimen, we will follow.  Tobias Alexander, MD 08/31/2019

## 2019-08-31 NOTE — Progress Notes (Signed)
[name] for Infectious Disease    Date of Admission:  08/15/2019   Total days of antibiotics 2          ID: Scott Strong is a 48 y.o. male with mrsa bacteremia, pulmonary emboli/cavitary lesions, and pericarditis Principal Problem:   Suspected COVID-19 virus infection Active Problems:   Severe sepsis ([facility])   Acute viral pericarditis   Respiratory distress   Hemoptysis   Acute respiratory failure with hypoxia (H[facility])    Subjective: Intubated last night. Remained febrile overnight as well  Medications:   chlorhexidine gluconate (MEDLINE KIT)  15 mL Mouth Rinse BID   Chlorhexidine Gluconate Cloth  6 each Topical Daily   Chlorhexidine Gluconate Cloth  6 each Topical Q0600   clonazePAM  1 mg Per Tube Q12H   colchicine  0.6 mg Oral BID   hydrocortisone sod succinate (SOLU-CORTEF) inj  50 mg Intravenous Q6H   ipratropium-albuterol  3 mL Nebulization QID   mouth rinse  15 mL Mouth Rinse BID   mouth rinse  15 mL Mouth Rinse 10 times per day   mupirocin ointment  1 application Nasal BID   pantoprazole (PROTONIX) IV  40 mg Intravenous Daily   QUEtiapine  50 mg Per Tube Q12H    Objective: Vital signs in last 24 hours: Temp:  [97.6 F (36.4 C)-101.2 F (38.4 C)] 97.6 F (36.4 C) (10/23 0700) Pulse Rate:  [26-141] 67 (10/23 0814) Resp:  [13-45] 32 (10/23 0814) BP: (58-214)/(32-201) 101/76 (10/23 0700) SpO2:  [83 %-100 %] 95 % (10/23 0817) FiO2 (%):  [70 %-100 %] 70 % (10/23 0817) Weight:  [86.2 kg] 86.2 kg (10/22 1500) Physical Exam  Constitutional: sedated. He appears well-developed and well-nourished. No distress.  HENT: OETT in place Cardiovascular: Normal rate, regular rhythm and normal heart sounds. Exam reveals no gallop and no friction rub.  No murmur heard.  Pulmonary/Chest: bilateral rhonchi. No respiratory distress. He has no wheezes.  Abdominal: Soft. Bowel sounds are decreased He exhibits no distension. There is no tenderness. .  Skin: Skin is  warm and dry. No rash noted. No erythema. Numerous tattoos  Lab Results Recent Labs    08/30/19 0249  08/30/19 1229  08/31/19 0512 08/31/19 0527  WBC 22.4*  --   --   --   --  22.4*  HGB 15.1   < >  --    < > 11.2* 12.0*  HCT 42.9   < >  --    < > 33.0* 35.9*  NA 130*   < > 134*   < > 139 137  K 5.1   < > 4.5   < > 4.9 5.2*  CL 95*  --  103  --   --  109  CO2 20*  --  18*  --   --  17*  BUN 35*  --  33*  --   --  37*  CREATININE 1.85*  --  1.59*  --   --  1.60*   < > = values in this interval not displayed.   Liver Panel Recent Labs    08/30/19 0249 08/30/19 1229 08/31/19 0527  PROT 7.2 6.4* 6.0*  ALBUMIN 2.2* 2.1* 2.5*  AST 68* 67* 55*  ALT 57* 51* 37  ALKPHOS 111 94 79  BILITOT 4.8* 4.4* 3.9*  BILIDIR 2.7*  --  2.4*  IBILI 2.1*  --  1.5*   Sedimentation Rate No results for input(s): ESRSEDRATE in the last 72 hours.  C-Reactive Protein Recent Labs    08/30/19 0249  CRP 22.2*    Microbiology: 10/23 blood cx pending Studies/Results: Ct Abdomen Pelvis Wo Contrast  Result Date: 08/26/2019 CLINICAL DATA:  Abdominal pain and shortness of breath EXAM: CT ABDOMEN AND PELVIS WITHOUT CONTRAST TECHNIQUE: Multidetector CT imaging of the abdomen and pelvis was performed following the standard protocol without IV contrast. COMPARISON:  None. FINDINGS: Cardiovascular: There is a small pericardial effusion. Coronary artery calcifications are seen. Scattered mild atherosclerosis is noted. 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 Mediastinum/Nodes: Limited due to the lack of intravenous contrast, however there does appear to be scattered subcarinal and prevascular lymph nodes seen. The thyroid gland, trachea and esophagus demonstrate no significant findings. Lungs/Pleura: There is multifocal extensive patchy airspace opacities seen throughout both lungs. There is also mild increased interstitial thickening at the lung bases. A small to moderate right and trace left  pleural effusion is seen. Upper abdomen: The visualized portion of the upper abdomen is unremarkable. Musculoskeletal/Chest wall: There is no chest wall mass or suspicious osseous finding. No acute osseous abnormality Lower chest: The visualized heart size within normal limits. No pericardial fluid/thickening. No hiatal hernia. The visualized portions of the lungs are clear. Hepatobiliary: Although limited due to the lack of intravenous contrast, normal in appearance without gross focal abnormality. No evidence of calcified gallstones or biliary ductal dilatation. Pancreas:  Unremarkable.  No surrounding inflammatory changes. Spleen: Normal in size. Although limited due to the lack of intravenous contrast, normal in appearance. Adrenals/Urinary Tract: Both adrenal glands appear normal. The kidneys and collecting system appear normal without evidence of urinary tract calculus or hydronephrosis. Bladder is unremarkable. Stomach/Bowel: The stomach, small bowel, and colon are normal in appearance. No inflammatory changes or obstructive findings. appendix is normal. Vascular/Lymphatic: There are no enlarged abdominal or pelvic lymph nodes. Scattered aortic atherosclerotic calcifications are seen without aneurysmal dilatation. Reproductive: The prostate is unremarkable. Other: No evidence of abdominal wall mass or hernia. Musculoskeletal: No acute or significant osseous findings. IMPRESSION: Multifocal patchy airspace opacities seen throughout both lungs. There are a spectrum of findings in the lungs which can be seen with acute atypical infection, as well as multifocal metastatic disease (no definite primary malignancy identified). Small to moderate right and trace left pleural effusion. Mediastinal adenopathy, likely reactive No acute intra-abdominal or pelvic pathology. Aortic Atherosclerosis (ICD10-I70.0). Electronically Signed   By: Prudencio Pair M.D.   On: 08/22/2019 22:46   Ct Chest Wo Contrast  Result Date:  08/23/2019 CLINICAL DATA:  Abdominal pain and shortness of breath EXAM: CT ABDOMEN AND PELVIS WITHOUT CONTRAST TECHNIQUE: Multidetector CT imaging of the abdomen and pelvis was performed following the standard protocol without IV contrast. COMPARISON:  None. FINDINGS: Cardiovascular: There is a small pericardial effusion. Coronary artery calcifications are seen. Scattered mild atherosclerosis is noted. 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 Mediastinum/Nodes: Limited due to the lack of intravenous contrast, however there does appear to be scattered subcarinal and prevascular lymph nodes seen. The thyroid gland, trachea and esophagus demonstrate no significant findings. Lungs/Pleura: There is multifocal extensive patchy airspace opacities seen throughout both lungs. There is also mild increased interstitial thickening at the lung bases. A small to moderate right and trace left pleural effusion is seen. Upper abdomen: The visualized portion of the upper abdomen is unremarkable. Musculoskeletal/Chest wall: There is no chest wall mass or suspicious osseous finding. No acute osseous abnormality Lower chest: The visualized heart size within normal limits. No pericardial fluid/thickening. No  hiatal hernia. The visualized portions of the lungs are clear. Hepatobiliary: Although limited due to the lack of intravenous contrast, normal in appearance without gross focal abnormality. No evidence of calcified gallstones or biliary ductal dilatation. Pancreas:  Unremarkable.  No surrounding inflammatory changes. Spleen: Normal in size. Although limited due to the lack of intravenous contrast, normal in appearance. Adrenals/Urinary Tract: Both adrenal glands appear normal. The kidneys and collecting system appear normal without evidence of urinary tract calculus or hydronephrosis. Bladder is unremarkable. Stomach/Bowel: The stomach, small bowel, and colon are normal in appearance. No inflammatory changes or  obstructive findings. appendix is normal. Vascular/Lymphatic: There are no enlarged abdominal or pelvic lymph nodes. Scattered aortic atherosclerotic calcifications are seen without aneurysmal dilatation. Reproductive: The prostate is unremarkable. Other: No evidence of abdominal wall mass or hernia. Musculoskeletal: No acute or significant osseous findings. IMPRESSION: Multifocal patchy airspace opacities seen throughout both lungs. There are a spectrum of findings in the lungs which can be seen with acute atypical infection, as well as multifocal metastatic disease (no definite primary malignancy identified). Small to moderate right and trace left pleural effusion. Mediastinal adenopathy, likely reactive No acute intra-abdominal or pelvic pathology. Aortic Atherosclerosis (ICD10-I70.0). Electronically Signed   By: Prudencio Pair M.D.   On: 08/31/2019 22:46   Ct Angio Chest Pe W Or Wo Contrast  Result Date: 08/30/2019 CLINICAL DATA:  Intermediate probability for pulmonary embolism. Positive D-dimer EXAM: CT ANGIOGRAPHY CHEST WITH CONTRAST TECHNIQUE: Multidetector CT imaging of the chest was performed using the standard protocol during bolus administration of intravenous contrast. Multiplanar CT image reconstructions and MIPs were obtained to evaluate the vascular anatomy. CONTRAST:  83m OMNIPAQUE IOHEXOL 350 MG/ML SOLN COMPARISON:  Noncontrast CT from yesterday FINDINGS: Cardiovascular: Normal heart size. Small pericardial effusion. There is likely smooth serosal enhancement, certainty limited by contrast timing. Patient has history of infective endocarditis per the chart. Negative aorta. No pulmonary embolism is seen. Coronary atherosclerosis that is notable for age. Mediastinum/Nodes: Negative for adenopathy or collection. Lungs/Pleura: Irregularly marginated rounded nodules and consolidative opacities in the bilateral lungs with areas of central cavitation that are non drainable. Bilateral small pleural  effusion with scalloping best seen on the right, progressed. No pneumothorax. Upper Abdomen: Negative Musculoskeletal: No evidence of spinal infection. Review of the MIP images confirms the above findings. IMPRESSION: 1. Multifocal cavitary pneumonia consistent with septic emboli. No emboli large enough for visualization by CTA. 2. Small pleural effusions that are complex and increased from yesterday, right larger than left. 3. Small pericardial effusion with possible pericarditis. Electronically Signed   By: JMonte FantasiaM.D.   On: 08/30/2019 06:38   Dg Chest Port 1 View  Result Date: 08/31/2019 CLINICAL DATA:  Intubation. EXAM: PORTABLE CHEST 1 VIEW COMPARISON:  Radiograph and CT yesterday FINDINGS: Endotracheal tube tip 4.1 cm from the carina. Enteric tube tip and side-port below the diaphragm in the stomach. Heterogeneous bilateral pulmonary opacities, cavitary components on CT are not well demonstrated radiographically. Bilateral pleural effusions. Mild enlargement of the cardiopericardial silhouette, small pericardial effusion on CT. No pneumothorax. IMPRESSION: 1. Endotracheal tube tip 4.1 cm from the carina. Enteric tube tip and side-port below the diaphragm in the stomach. 2. Multifocal pneumonia and bilateral pleural effusions, not significantly changed from yesterday. Electronically Signed   By: MKeith RakeM.D.   On: 08/31/2019 01:59   Dg Chest Port 1 View  Result Date: 08/30/2019 CLINICAL DATA:  Respiratory failure EXAM: PORTABLE CHEST 1 VIEW COMPARISON:  08/15/2019, CT chest, 08/30/2019, 6:14  a.m. FINDINGS: No significant interval change in extensive bilateral heterogeneous and nodular airspace opacities with small, layering bilateral pleural effusions. Enlargement of the cardiac silhouette, in keeping with pericardial effusion identified by prior CT. IMPRESSION: 1. No significant interval change in extensive bilateral heterogeneous and nodular airspace opacities with small, layering  bilateral pleural effusions. 2. Enlargement of the cardiac silhouette, in keeping with pericardial effusion identified by prior CT. Electronically Signed   By: Eddie Candle M.D.   On: 08/30/2019 16:42   Dg Chest Port 1 View  Result Date: 08/19/2019 CLINICAL DATA:  Right-sided chest pain and shortness of breath for 3 days. Diaphoresis. EXAM: PORTABLE CHEST 1 VIEW COMPARISON:  None. FINDINGS: Heart size is within normal limits allowing for low lung volumes. Bibasilar atelectasis noted. Multifocal ill-defined nodular opacities are seen in both lungs which could be infectious, inflammatory, or neoplastic in etiology. IMPRESSION: Low lung volumes with ill-defined bilateral nodular opacities, which could be infectious, inflammatory or neoplastic in etiology. Recommend further evaluation with two-view chest at full inspiration, or chest CT. Electronically Signed   By: Marlaine Hind M.D.   On: 08/28/2019 19:39   US Abdomen Limited Ruq  Result Date: 08/30/2019 CLINICAL DATA:  Elevated LFTs EXAM: ULTRASOUND ABDOMEN LIMITED RIGHT UPPER QUADRANT COMPARISON:  None. FINDINGS: Gallbladder: No gallstones or wall thickening visualized. No sonographic Murphy sign noted by sonographer. Common bile duct: Diameter: 3.1 mm. Liver: No focal lesion identified. Within normal limits in parenchymal echogenicity. Portal vein is patent on color Doppler imaging with normal direction of blood flow towards the liver. Other: Small right pleural effusion is noted similar to that seen on prior CT examination. IMPRESSION: Unremarkable right upper quadrant ultrasound. Right pleural effusion similar to that seen on prior exam. Electronically Signed   By: Inez Catalina M.D.   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 Will continue with renally dosed vancomycin. If worsens, may consider to switch to another agent.    [facility] for Infectious Diseases Cell: [phone] Pager: [phone]  08/31/2019, 8:52 AM

## 2019-08-31 NOTE — Progress Notes (Signed)
Critical ABG results given to Dr. Gilford Raid. Vent changes made (vt-7cc's, RR-18) per her verbal order. RT will continue to monitor.

## 2019-08-31 NOTE — Progress Notes (Signed)
Pharmacy Antibiotic Note  Mahmud Carriker is a 48 y.o. male admitted on 16-Sep-2019 with MRSA bacteremia and concern for endocarditis with pulmonary septic emboli on Chest CT. Pharmacy consulted for Vancomycin dosing.   The patient has resolving AKI with SCr trend down to 1.6, CrCl~60 ml/min. Will adjust the Vancomycin dose today. ID on board for work-up and evaluation. Repeat blood cultures sent today.  Plan: - Adjust Vancomycin to 1500 mg IV every 24 hours - Will continue to follow renal function, culture results, LOT, and antibiotic de-escalation plans   Height: 5\' 8"  (172.7 cm) Weight: 190 lb 0.6 oz (86.2 kg) IBW/kg (Calculated) : 68.4  Temp (24hrs), Avg:98.5 F (36.9 C), Min:94.4 F (34.7 C), Max:101.2 F (38.4 C)  Recent Labs  Lab 2019/09/16 1838  08/30/19 0249 08/30/19 0727 08/30/19 1000 08/30/19 1229 08/30/19 1505 08/31/19 0527  WBC 23.8*  --  22.4*  --   --   --   --  22.4*  CREATININE 2.70*  --  1.85*  --   --  1.59*  --  1.60*  LATICACIDVEN 2.8*   < > 3.2* 2.8* 3.3*  --  3.7* 2.2*   < > = values in this interval not displayed.    Estimated Creatinine Clearance: 60.3 mL/min (A) (by C-G formula based on SCr of 1.6 mg/dL (H)).    No Known Allergies   Vanc 10/22>> Cefepime 10/22>>10/22 CTX x 1 10/21 Azithro x 1 10/21  10/21 Blood >> 2/2 GPC in clusters, MRSA on BCID 10/21 UCx >> 3k Staph aureus 10/22 RVP - +rhinovirus/enterovirus 10/22 COVID >> neg 10/22 MRSA PCR >> neg 10/22 RCx >> 10/23 BCx >>  Thank you for allowing pharmacy to be a part of this patient's care.  Alycia Rossetti, PharmD, BCPS Clinical Pharmacist Clinical phone for 08/31/2019: [phone] 08/31/2019 11:40 AM   **Pharmacist phone directory can now be found on [email or web address] ([email or web address]).  Listed under [place].

## 2019-08-31 NOTE — Progress Notes (Signed)
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  History of present illness   48 year old male patient without significant medical history other than IV drug use.  Presented to the emergency room on 10/21 with chief complaint of about 3-day history of pleuritic type chest discomfort, cough with occasional streaky hemoptysis and progressive shortness of breath.  He also reported intermittent fever and myalgia.  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  In the emergency room he was found to be tachycardic and tachypneic meeting SIRS parameters.  Lactic acid was slightly elevated at 2.8, white blood cell count 50,000, COVID-19 negative.  CT chest was obtained after abnormal chest x-ray evaluated this demonstrated multifocal patchy opacities he was admitted with a working diagnosis of sepsis.  He was administered IV fluids, supplemental oxygen, cultures were obtained and empiric antibiotics started.  Not long after hospital admission, early in the morning on 10/22 Rapid response call was made.  The patient had become progressively hypotensive requiring repeat fluid challenge his temperature spiked to 102.4 and he remained tachycardic.  He had a elevated D-dimer and because of this a CT angiogram was obtained this showed progression of bilateral pleural effusions particularly on the right with the right effusion appearing more complicated in nature, he also had what appeared to be multifocal cavitary areas of consolidation consistent with septic emboli in addition to small pericardial effusion.  Later that morning his blood cultures were found to be positive with initial study  suggesting MRSA.  He continued to decline with worsening work of breathing and because of this critical care was asked to evaluate.  Past Medical History  No significant medical history other than IV drug abuse  Significant Hospital Events   10/21 presented to the emergency room with chief complaint of shortness of breath and chest pain  Consults:  Critical  care consulted 10/22 Infectious disease 10/22 Card 10/22 Procedures:    Significant Diagnostic Tests:   CT chest (angiogram) 10/22: 1. Multifocal cavitary pneumonia consistent with septic emboli. No emboli large enough for visualization by CTA. 2. Small pleural effusions that are complex and increased from yesterday, right larger than left. 3. Small pericardial effusion with possible pericarditis. TTE 10/22>>>LVEF 60-65% no overt vegetations Micro Data:   RVP 10/22: rhinovirus/enterovirus covid 19 X 2 (on 10/21 and 10/22) both negative Blood 10/21: +MRSA 10/23 blood:   Antimicrobials:  vanc 10/21-> Cefepime 10/21  Interim history/subjective:  10/23: transferred to ICU yesterday. Decompensated further overnight req intubation around 0200 this am. tmax to 101.2. started on pressor overnight as well.  Objective   Blood pressure 101/73, pulse 67, temperature 97.6 F (36.4 C), temperature source Oral, resp. rate (!) 31, height 5\' 8"  (1.727 m), weight 86.2 kg, SpO2 98 %.    Vent Mode: PRVC FiO2 (%):  [80 %-100 %] 80 % Set Rate:  [12 bmp-18 bmp] 18 bmp Vt Set:  [360 mL-430 mL] 430 mL PEEP:  [5 cmH20] 5 cmH20 Plateau Pressure:  [16 cmH20-19 cmH20] 19 cmH20   Intake/Output Summary (Last 24 hours) at 08/31/2019 0740 Last data filed at 08/31/2019 0640 Gross per 24 hour  Intake 1708.46 ml  Output 1475  ml  Net 233.46 ml   Filed Weights   08/09/2019 1845 08/30/19 1500  Weight: 81.6 kg 86.2 kg    Examination: General: ill appearing but comfortable on vent, sedated HENT: Poor dentition sclera are icteric mucous membranes  currently moist no JVD Lungs: rhonchi bilaterally, no wheeze Cardiovascular: sinus, no audible murmur Abdomen: Soft not tender Extremities: Warm and dry Neuro: sedated, non focal   Assessment and plan   Severe sepsis 2/2 MRSA bacteremia w/ 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  Plan Cont IVFs Cont vanc F/u TTE; if formal read is neg will eventually need TEE  Acute hypoxic respiratory failure w/ bilateral MRSA PNA septic pulmonary emboli w/ cavitations B/L pleural effusions -Titrate vent -Cont pulse ox -bronchodilators as needed -cxr in am, stable for now  Mild lactic acidosis Plan -improving Arf:  -improving -monitor I/o and indices Hypomag/hyperkalemia:  -replace mag and recheck K around 1200   Shock 2/2 sepsis as above:  Titrate pressors to map >65 Pericarditis:  Clinical symptoms, ekg and effusion on echo  Rhinovirus/enterovirus Plan Supportive care  Pain w/ h/o IVDA.  -could be component of withdrawal Plan precedex gtt Low dose PRN fent   Normocytic anemia:  -follow hgb, stable at this time  Shock liver with hyperbilirubinemia Plan  Improving ruq u/s negative  Best practice:  Diet: NPO, start tf Pain/Anxiety/Delirium protocol (if indicated): precedex started 10/22 VAP protocol (if indicated): NA DVT prophylaxis: SCD GI prophylaxis: ppi Glucose control: NA Mobility: BR Code Status: full code  Family Communication: pending Disposition: ICU  Labs   CBC: Recent Labs  Lab 09/03/2019 1838 08/30/19 0249 08/30/19 0546 08/31/19 0104 08/31/19 0254 08/31/19 0512 08/31/19 0527  WBC 23.8* 22.4*  --   --   --   --  22.4*  NEUTROABS 20.2*  --   --   --   --   --   --   HGB 15.1 15.1 13.3 11.6* 13.3 11.2* 12.0*  HCT 43.8 42.9 39.0 34.0* 39.0 33.0* 35.9*  MCV 90.5 89.9  --   --   --   --  93.0  PLT 281 247  --   --   --   --  248    Basic Metabolic Panel: Recent Labs  Lab  09/03/2019 1838 08/30/19 0249  08/30/19 1229 08/31/19 0104 08/31/19 0254 08/31/19 0512 08/31/19 0527  NA 128* 130*   < > 134* 137 137 139 137  K 4.4 5.1   < > 4.5 4.5 5.1 4.9 5.2*  CL 90* 95*  --  103  --   --   --  109  CO2 22 20*  --  18*  --   --   --  17*  GLUCOSE 176* 102*  --  169*  --   --   --  155*  BUN 41* 35*  --  33*  --   --   --  37*  CREATININE 2.70* 1.85*  --  1.59*  --   --   --  1.60*  CALCIUM 8.8* 8.5*  --  8.1*  --   --   --  8.2*  MG  --   --   --   --   --   --   --  1.7  PHOS  --   --   --   --   --   --   --  4.7*   < > =  values in this interval not displayed.   GFR: Estimated Creatinine Clearance: 60.3 mL/min (A) (by C-G formula based on SCr of 1.6 mg/dL (H)). Recent Labs  Lab 09/02/2019 1838  08/30/19 0249 08/30/19 0727 08/30/19 1000 08/30/19 1505 08/31/19 0527  PROCALCITON  --   --  7.65  --   --   --   --   WBC 23.8*  --  22.4*  --   --   --  22.4*  LATICACIDVEN 2.8*   < > 3.2* 2.8* 3.3* 3.7* 2.2*   < > = values in this interval not displayed.    Liver Function Tests: Recent Labs  Lab 08/12/2019 1838 08/30/19 0249 08/30/19 1229 08/31/19 0527  AST 65* 68* 67* 55*  ALT 66* 57* 51* 37  ALKPHOS 128* 111 94 79  BILITOT 4.8* 4.8* 4.4* 3.9*  PROT 7.9 7.2 6.4* 6.0*  ALBUMIN 2.4* 2.2* 2.1* 2.5*   Recent Labs  Lab 08/25/2019 1838  LIPASE 173*   No results for input(s): AMMONIA in the last 168 hours.  ABG    Component Value Date/Time   PHART 7.354 08/31/2019 0512   PCO2ART 37.0 08/31/2019 0512   PO2ART 204.0 (H) 08/31/2019 0512   HCO3 20.7 08/31/2019 0512   TCO2 22 08/31/2019 0512   ACIDBASEDEF 4.0 (H) 08/31/2019 0512   O2SAT 100.0 08/31/2019 0512     Coagulation Profile: Recent Labs  Lab 09/03/2019 1838  INR 1.4*    Cardiac Enzymes: No results for input(s): CKTOTAL, CKMB, CKMBINDEX, TROPONINI in the last 168 hours.  HbA1C: No results found for: HGBA1C  CBG: Recent Labs  Lab 08/13/2019 1834 08/30/19 0529 08/30/19 1230  08/30/19 1540  GLUCAP 173* 93 171* 130*     Critical care time: The patient is critically ill with multiple organ systems failure and requires high complexity decision making for assessment and support, frequent evaluation and titration of therapies, application of advanced monitoring technologies and extensive interpretation of multiple databases.  Critical care time 42 mins. This represents my time independent of the NPs time taking care of the pt. This is excluding procedures.    Audria Nine DO After hours pager: [phone]  [facility] 08/31/2019, 7:40 AM

## 2019-09-01 ENCOUNTER — Inpatient Hospital Stay (HOSPITAL_COMMUNITY): Payer: Medicaid Other

## 2019-09-01 DIAGNOSIS — I308 Other forms of acute pericarditis: Secondary | ICD-10-CM

## 2019-09-01 LAB — COMPREHENSIVE METABOLIC PANEL
ALT: 30 U/L (ref 0–44)
AST: 42 U/L — ABNORMAL HIGH (ref 15–41)
Albumin: 1.9 g/dL — ABNORMAL LOW (ref 3.5–5.0)
Alkaline Phosphatase: 89 U/L (ref 38–126)
Anion gap: 9 (ref 5–15)
BUN: 36 mg/dL — ABNORMAL HIGH (ref 6–20)
CO2: 19 mmol/L — ABNORMAL LOW (ref 22–32)
Calcium: 8.6 mg/dL — ABNORMAL LOW (ref 8.9–10.3)
Chloride: 115 mmol/L — ABNORMAL HIGH (ref 98–111)
Creatinine, Ser: 1.14 mg/dL (ref 0.61–1.24)
GFR calc Af Amer: >60 mL/min (ref >60)
GFR calc non Af Amer: >60 mL/min (ref >60)
Glucose, Bld: 210 mg/dL — ABNORMAL HIGH (ref 70–99)
Potassium: 4.5 mmol/L (ref 3.5–5.1)
Sodium: 143 mmol/L (ref 135–145)
Total Bilirubin: 3.5 mg/dL — ABNORMAL HIGH (ref 0.3–1.2)
Total Protein: 5.7 g/dL — ABNORMAL LOW (ref 6.5–8.1)

## 2019-09-01 LAB — CULTURE, BLOOD (ROUTINE X 2)
Special Requests: ADEQUATE
Special Requests: ADEQUATE

## 2019-09-01 LAB — URINE CULTURE: Culture: 3000 — AB

## 2019-09-01 LAB — GLUCOSE, CAPILLARY
Glucose-Capillary: 174 mg/dL — ABNORMAL HIGH (ref 70–99)
Glucose-Capillary: 188 mg/dL — ABNORMAL HIGH (ref 70–99)
Glucose-Capillary: 191 mg/dL — ABNORMAL HIGH (ref 70–99)
Glucose-Capillary: 195 mg/dL — ABNORMAL HIGH (ref 70–99)
Glucose-Capillary: 211 mg/dL — ABNORMAL HIGH (ref 70–99)
Glucose-Capillary: 211 mg/dL — ABNORMAL HIGH (ref 70–99)

## 2019-09-01 LAB — CULTURE, RESPIRATORY W GRAM STAIN: Gram Stain: NONE SEEN

## 2019-09-01 LAB — CBC
HCT: 33.1 % — ABNORMAL LOW (ref 39.0–52.0)
Hemoglobin: 11.5 g/dL — ABNORMAL LOW (ref 13.0–17.0)
MCH: 32.3 pg (ref 26.0–34.0)
MCHC: 34.7 g/dL (ref 30.0–36.0)
MCV: 93 fL (ref 80.0–100.0)
Platelets: 216 10*3/uL (ref 150–400)
RBC: 3.56 MIL/uL — ABNORMAL LOW (ref 4.22–5.81)
RDW: 14.6 % (ref 11.5–15.5)
WBC: 19.6 10*3/uL — ABNORMAL HIGH (ref 4.0–10.5)
nRBC: 0 % (ref 0.0–0.2)

## 2019-09-01 LAB — HEMOGLOBIN A1C
Hgb A1c MFr Bld: 5.1 % (ref 4.8–5.6)
Mean Plasma Glucose: 99.67 mg/dL

## 2019-09-01 LAB — MAGNESIUM: Magnesium: 2.2 mg/dL (ref 1.7–2.4)

## 2019-09-01 LAB — TRIGLYCERIDES: Triglycerides: 227 mg/dL — ABNORMAL HIGH (ref <150)

## 2019-09-01 MED ORDER — HEPARIN SODIUM (PORCINE) 5000 UNIT/ML IJ SOLN
5000.0000 [IU] | Freq: Three times a day (TID) | INTRAMUSCULAR | Status: DC
  Administered 2019-09-01 – 2019-09-04 (×9): 5000 [IU] via SUBCUTANEOUS
  Filled 2019-09-01 (×9): qty 1

## 2019-09-01 MED ORDER — HEPARIN SODIUM (PORCINE) 5000 UNIT/ML IJ SOLN: 5000.0000 [IU] | Freq: Three times a day (TID) | INTRAMUSCULAR | Status: DC

## 2019-09-01 MED ORDER — SENNOSIDES-DOCUSATE SODIUM 8.6-50 MG PO TABS
1.0000 | ORAL_TABLET | Freq: Two times a day (BID) | ORAL | Status: DC
  Administered 2019-09-01 – 2019-09-03 (×5): 1
  Filled 2019-09-01 (×5): qty 1

## 2019-09-01 MED ORDER — INSULIN ASPART 100 UNIT/ML ~~LOC~~ SOLN
0.0000 [IU] | SUBCUTANEOUS | Status: DC
  Administered 2019-09-01 (×2): 3 [IU] via SUBCUTANEOUS
  Administered 2019-09-01: 12:00:00 5 [IU] via SUBCUTANEOUS
  Administered 2019-09-01 – 2019-09-02 (×3): 3 [IU] via SUBCUTANEOUS

## 2019-09-01 NOTE — Progress Notes (Signed)
   Chart reviewed- I personally reviewed the echo on 10/22. There is a small pericardial effusion, but no overt TV endocarditis. If this was the source, the vegetation may have completely embolized - could consider TEE next week if more stable and it would change management. Would at least recommend a repeat limited echo next week to look for changes in the pericardial effusion.  Pixie Casino, MD, [facility], [place] Director of the [facility] Diplomate of the American Board of Clinical Lipidology Attending Cardiologist  Direct Dial: [phone]  Fax: [phone]  Website:  [email or web address]

## 2019-09-01 NOTE — Progress Notes (Signed)
ID PROGRESS NOTE   Blood cx from 10/23 repeated positive. Remains intubated, sedated. Fever curve trending down. AKI improved on labs today   A/P: MRSA bacteremia with pulm septic emboli, TTE shows no vegetation, possibly has embolized all of TV veg. With resp distress requiring ventilation. Also complicated with small pericardial effusion and pericarditis  - continue on vancomycin, will need to reassess dosing given improved renal function - repeat blood cx on 10/26, if still persistently positive, would recommend imaging of spine as he is stabilized, and TEE.  Scott Strong for Infectious Diseases [phone]

## 2019-09-01 NOTE — Progress Notes (Addendum)
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  History of present illness   48 year old male patient without significant medical history other than IV drug use.  Presented to the emergency room on 10/21 with chief complaint of about 3-day history of pleuritic type chest discomfort, cough with occasional streaky hemoptysis and progressive shortness of breath.  He also reported intermittent fever and myalgia.  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  In the emergency room he was found to be tachycardic and tachypneic meeting SIRS parameters.  Lactic acid was slightly elevated at 2.8, white blood cell count 50,000, COVID-19 negative.  CT chest was obtained after abnormal chest x-ray evaluated this demonstrated multifocal patchy opacities he was admitted with a working diagnosis of sepsis.  He was administered IV fluids, supplemental oxygen, cultures were obtained and empiric antibiotics started.  Not long after hospital admission, early in the morning on 10/22 Rapid response call was made.  The patient had become progressively hypotensive requiring repeat fluid challenge his temperature spiked to 102.4 and he remained tachycardic.  He had a elevated D-dimer and because of this a CT angiogram was obtained this showed progression of bilateral pleural effusions particularly on the right with the right effusion appearing more complicated in nature, he also had what appeared to be multifocal cavitary areas of consolidation consistent with septic emboli in addition to small pericardial effusion.  Later that morning his blood cultures were found to be positive with initial study suggesting MRSA.  He  continued to decline with worsening work of breathing and because of this critical care was asked to evaluate.  Past Medical History  No significant medical history other than IV drug abuse  Significant Hospital Events   10/21 presented to the emergency room with chief complaint of shortness of breath and chest pain  Consults:  Critical  care consulted 10/22 Infectious disease 10/22 Card 10/22 Procedures:  10/22: Intubated  Significant Diagnostic Tests:  CT chest (angiogram) 10/22: 1. Multifocal cavitary pneumonia consistent with septic emboli. No emboli large enough for visualization by CTA. 2. Small pleural effusions that are complex and increased from yesterday, right larger than left. 3. Small pericardial effusion with possible pericarditis. TTE 10/22>>>LVEF 60-65% no overt vegetations  Micro Data:  RVP 10/22: rhinovirus/enterovirus covid 19 X 2 (on 10/21 and 10/22) both negative Blood 10/21: +MRSA 10/23 blood: + GPC  Antimicrobials:  vanc 10/21-> Cefepime 10/21>>10/23  Interim history/subjective:  +2 L I/O, Net + 6.3 L this admission Fever curve improving, leukocytosis and renal function improving.  Blood Cx 10/23 remain + GPC.  Low dose levophed at 5 mcg/min.  Remains on propofol and fentanyl drip for ETT tolerance.  RASS - 4 on exam  Objective   Blood pressure 95/65, pulse 90, temperature 99.5 F (37.5 C), temperature source Axillary, resp. rate (!) 35, height 5\' 8"  (1.727 m), weight 90.9 kg, SpO2 95 %.    Vent Mode: PRVC FiO2 (%):  [50 %-80 %] 50 % Set Rate:  [18 bmp-20 bmp] 18 bmp Vt Set:  [430 mL] 430 mL PEEP:  [5 cmH20] 5 cmH20 Plateau Pressure:  [19 cmH20-22 cmH20] 21 cmH20   Intake/Output Summary (Last 24 hours)  at 09/01/2019 1610 Last data filed at 09/01/2019 0600 Gross per 24 hour  Intake 3472.05 ml  Output 2275 ml  Net 1197.05 ml   Filed Weights   08/19/2019 1845 08/30/19 1500 09/01/19 0500  Weight: 81.6 kg 86.2 kg 90.9 kg     Examination: General: adult male intubated and sedated HENT: mild scleral edema. Moist mucous membranes Lungs: scattered rhonchi with thick beige secretions via ETT Cardiovascular: SR on telemetry. No murmur Abdomen: soft, NT, ND Extremities: Warm and well perfused Neuro: RASS -4, pupils 2 and reactive bilaterally. + cough.    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   Mild lactic acidosis -improving  Pericarditis:  Clinical symptoms, ekg and effusion on echo Plan: -Colchicine  Rhinovirus/enterovirus Plan: -Supportive care  Pain w/ h/o IVDA.  -could be component of withdrawal Plan: -Propofol and Fentanyl drip for ETT tolerance  Normocytic anemia:  Plan: -Goal Hgb > 7, no tx indicated  Shock liver with hyperbilirubinemia -Improving  Acute kidney injury Improving Plan: -Continue supportive care, avoid nephrotoxins -Appears volume resuscitated, hold IVF.  IVF resuscitation as needed  Hyperglycemia No history of DM, likely steroids/stress induced Plan: Start SSI Send hA1c  Best practice:  Diet: Tube feeds Pain/Anxiety/Delirium protocol (if indicated): Precedex and fentanyl drip VAP protocol (if indicated): Y DVT prophylaxis: SCD, start DVT ppx today GI prophylaxis: PPI Glucose control: SSI Mobility: BR Code Status: Full code  Family Communication: pending Disposition: ICU  Labs   CBC: Recent Labs  Lab 08/10/2019 1838 08/30/19 0249   08/31/19 0104 08/31/19 0254 08/31/19 0512 08/31/19 0527 09/01/19 0306  WBC 23.8* 22.4*  --   --   --   --  22.4* 19.6*  NEUTROABS 20.2*  --   --   --   --   --   --   --   HGB 15.1 15.1   < > 11.6* 13.3 11.2* 12.0* 11.5*  HCT 43.8 42.9   < > 34.0* 39.0 33.0* 35.9* 33.1*  MCV 90.5 89.9  --   --   --   --  93.0 93.0  PLT 281 247  --   --   --   --  248 216   < > = values in this interval not displayed.    Basic Metabolic Panel: Recent Labs  Lab 08/30/2019 1838 08/30/19 0249  08/30/19 1229 08/31/19 0104 08/31/19 0254 08/31/19 0512 08/31/19 0527 08/31/19 1150 09/01/19 0306  NA 128* 130*   < > 134* 137 137 139 137  --  143  K 4.4 5.1   < > 4.5 4.5 5.1 4.9 5.2* 4.5 4.5  CL 90* 95*  --  103  --   --   --  109  --  115*  CO2 22 20*  --  18*  --   --   --  17*  --  19*  GLUCOSE 176* 102*  --  169*  --   --   --  155*  --  210*  BUN 41* 35*  --  33*  --   --   --  37*  --  36*  CREATININE 2.70* 1.85*  --  1.59*  --   --   --  1.60*  --  1.14  CALCIUM 8.8* 8.5*  --  8.1*  --   --   --  8.2*  --  8.6*  MG  --   --   --   --   --   --   --  1.7  --  2.2  PHOS  --   --   --   --   --   --   --  4.7*  --   --    < > = values in this interval not displayed.   GFR: Estimated Creatinine Clearance: 86.8 mL/min (by C-G formula based on SCr of 1.14 mg/dL). Recent Labs  Lab 09/08/2019 1838  08/30/19 0249  08/30/19 1000 08/30/19 1505 08/31/19 0527 08/31/19 1150 09/01/19 0306  PROCALCITON  --   --  7.65  --   --   --   --   --   --   WBC 23.8*  --  22.4*  --   --   --  22.4*  --  19.6*  LATICACIDVEN 2.8*   < > 3.2*   < > 3.3* 3.7* 2.2* 2.0*  --    < > = values in this interval not displayed.    Liver Function Tests: Recent Labs  Lab 08/12/2019 1838 08/30/19 0249 08/30/19 1229 08/31/19 0527 09/01/19 0306  AST 65* 68* 67* 55* 42*  ALT 66* 57* 51* 37 30  ALKPHOS 128* 111 94 79 89  BILITOT 4.8* 4.8* 4.4* 3.9* 3.5*  PROT 7.9 7.2 6.4* 6.0* 5.7*  ALBUMIN 2.4* 2.2* 2.1* 2.5* 1.9*    Recent Labs  Lab 08/22/2019 1838  LIPASE 173*   No results for input(s): AMMONIA in the last 168 hours.  ABG    Component Value Date/Time   PHART 7.354 08/31/2019 0512   PCO2ART 37.0 08/31/2019 0512   PO2ART 204.0 (H) 08/31/2019 0512   HCO3 20.7 08/31/2019 0512   TCO2 22 08/31/2019 0512   ACIDBASEDEF 4.0 (H) 08/31/2019 0512   O2SAT 100.0 08/31/2019 0512     Coagulation Profile: Recent Labs  Lab 09/03/2019 1838  INR 1.4*    Cardiac Enzymes: No results for input(s): CKTOTAL, CKMB, CKMBINDEX, TROPONINI in the last 168 hours.  HbA1C: No results found for: HGBA1C  CBG: Recent Labs  Lab 08/31/19 1505 08/31/19 1923 08/31/19 2315 09/01/19 0320 09/01/19 0705  GLUCAP 167* 143* 217* 211* 195*     Paulita Fujita, ACNP [facility]  [phone] CCT: 41 min    ATTESTATION & SIGNATURE   STAFF NOTE: I, Dr Ann Lions have personally reviewed patient's available data, including medical history, events of note, physical examination and test results as part of my evaluation. I have discussed with resident/NP and other care providers such as pharmacist, RN and RRT.  In addition,  I personally evaluated patient and elicited key findings of   S: Date of admit 08/10/2019 with LOS 2 for today 09/01/2019 : Scott Strong is  -on the ventilator at this time with 50% oxygen and 5 of PEEP.  He is on Levophed but coming down.  He is on fentanyl drip and propofol.  He has fever and tachypnea according to the nurse.  His kidney function has normalized.  O:  Blood pressure 92/61, pulse (!) 110, temperature 99.9 F (37.7 C), temperature source Axillary, resp. rate (!) 37, height  5\' 8"  (1.727 m), weight 90.9 kg, SpO2 91 %.   Cachectic male on the ventilator with 50% oxygen.  Tachypneic.  Synchronous sedated with RASS sedation score of -3      A: Acute respiratory failure with septic shock in the setting of IV drug abuse MRSA pneumonia septic emboli and suspected  endocarditis  Resolved acute kidney injury  P: MAP goal greater than 65 with pressors Monitor kidney function Full ventilator support Antibiotics per ID   Anti-infectives (From admission, onward)   Start     Dose/Rate Route Frequency Ordered Stop   09/01/19 0800  vancomycin (VANCOCIN) 1,500 mg in sodium chloride 0.9 % 500 mL IVPB     1,500 mg 250 mL/hr over 120 Minutes Intravenous Every 24 hours 08/31/19 1135     08/31/19 0800  vancomycin (VANCOCIN) 1,250 mg in sodium chloride 0.9 % 250 mL IVPB  Status:  Discontinued     1,250 mg 166.7 mL/hr over 90 Minutes Intravenous Every 24 hours 08/30/19 0949 08/31/19 1135   08/30/19 0600  vancomycin (VANCOCIN) 1,500 mg in sodium chloride 0.9 % 500 mL IVPB     1,500 mg 250 mL/hr over 120 Minutes Intravenous  Once 08/30/19 0542 08/30/19 0835   08/30/19 0600  ceFEPIme (MAXIPIME) 2 g in sodium chloride 0.9 % 100 mL IVPB  Status:  Discontinued     2 g 200 mL/hr over 30 Minutes Intravenous Every 12 hours 08/30/19 0542 08/30/19 1114   08/30/19 0542  vancomycin variable dose per unstable renal function (pharmacist dosing)  Status:  Discontinued      Does not apply See admin instructions 08/30/19 0542 08/30/19 0949   2019/06/12 1915  cefTRIAXone (ROCEPHIN) 2 g in sodium chloride 0.9 % 100 mL IVPB  Status:  Discontinued     2 g 200 mL/hr over 30 Minutes Intravenous Every 24 hours 2019/06/12 1903 08/30/19 0532   2019/06/12 1915  azithromycin (ZITHROMAX) 500 mg in sodium chloride 0.9 % 250 mL IVPB  Status:  Discontinued     500 mg 250 mL/hr over 60 Minutes Intravenous Every 24 hours 2019/06/12 1903 08/30/19 0532       Rest per NP/medical resident whose note is outlined above and that I agree with  The patient is critically ill with multiple organ systems failure and requires high complexity decision making for assessment and support, frequent evaluation and titration of therapies, application of advanced monitoring technologies and extensive interpretation of  multiple databases.   Critical Care Time devoted to patient care services described in this note is  30  Minutes. This time reflects time of care of this signee Dr Kalman ShanMurali Nadyne Gariepy. This critical care time does not reflect procedure time, or teaching time or supervisory time of PA/NP/Med student/Med Resident etc but could involve care discussion time     Dr. Kalman ShanMurali Malahki Gasaway, M.D., [facility].C.C.P Pulmonary and Critical Care Medicine Staff Physician [facility] Pager: [phone], If no answer or between  15:00h - 7:00h: call [phone]  09/01/2019 3:02 PM

## 2019-09-02 DIAGNOSIS — J9601 Acute respiratory failure with hypoxia: Secondary | ICD-10-CM

## 2019-09-02 LAB — MAGNESIUM: Magnesium: 2.1 mg/dL (ref 1.7–2.4)

## 2019-09-02 LAB — CBC
HCT: 37 % — ABNORMAL LOW (ref 39.0–52.0)
Hemoglobin: 12.1 g/dL — ABNORMAL LOW (ref 13.0–17.0)
MCH: 30.9 pg (ref 26.0–34.0)
MCHC: 32.7 g/dL (ref 30.0–36.0)
MCV: 94.4 fL (ref 80.0–100.0)
Platelets: 194 10*3/uL (ref 150–400)
RBC: 3.92 MIL/uL — ABNORMAL LOW (ref 4.22–5.81)
RDW: 15.2 % (ref 11.5–15.5)
WBC: 22.1 10*3/uL — ABNORMAL HIGH (ref 4.0–10.5)
nRBC: 0.2 % (ref 0.0–0.2)

## 2019-09-02 LAB — COMPREHENSIVE METABOLIC PANEL
ALT: 33 U/L (ref 0–44)
AST: 61 U/L — ABNORMAL HIGH (ref 15–41)
Albumin: 1.6 g/dL — ABNORMAL LOW (ref 3.5–5.0)
Alkaline Phosphatase: 105 U/L (ref 38–126)
Anion gap: 9 (ref 5–15)
BUN: 39 mg/dL — ABNORMAL HIGH (ref 6–20)
CO2: 18 mmol/L — ABNORMAL LOW (ref 22–32)
Calcium: 8.3 mg/dL — ABNORMAL LOW (ref 8.9–10.3)
Chloride: 116 mmol/L — ABNORMAL HIGH (ref 98–111)
Creatinine, Ser: 0.98 mg/dL (ref 0.61–1.24)
GFR calc Af Amer: >60 mL/min (ref >60)
GFR calc non Af Amer: >60 mL/min (ref >60)
Glucose, Bld: 220 mg/dL — ABNORMAL HIGH (ref 70–99)
Potassium: 5.6 mmol/L — ABNORMAL HIGH (ref 3.5–5.1)
Sodium: 143 mmol/L (ref 135–145)
Total Bilirubin: 3.8 mg/dL — ABNORMAL HIGH (ref 0.3–1.2)
Total Protein: 5.4 g/dL — ABNORMAL LOW (ref 6.5–8.1)

## 2019-09-02 LAB — GLUCOSE, CAPILLARY
Glucose-Capillary: 168 mg/dL — ABNORMAL HIGH (ref 70–99)
Glucose-Capillary: 182 mg/dL — ABNORMAL HIGH (ref 70–99)
Glucose-Capillary: 187 mg/dL — ABNORMAL HIGH (ref 70–99)
Glucose-Capillary: 194 mg/dL — ABNORMAL HIGH (ref 70–99)
Glucose-Capillary: 200 mg/dL — ABNORMAL HIGH (ref 70–99)
Glucose-Capillary: 211 mg/dL — ABNORMAL HIGH (ref 70–99)

## 2019-09-02 LAB — PHOSPHORUS: Phosphorus: 3.3 mg/dL (ref 2.5–4.6)

## 2019-09-02 LAB — TRIGLYCERIDES: Triglycerides: 168 mg/dL — ABNORMAL HIGH (ref <150)

## 2019-09-02 MED ORDER — DOCUSATE SODIUM 50 MG/5ML PO LIQD
100.0000 mg | Freq: Two times a day (BID) | ORAL | Status: DC
  Administered 2019-09-02 – 2019-09-03 (×3): 100 mg
  Filled 2019-09-02 (×3): qty 10

## 2019-09-02 MED ORDER — INSULIN ASPART 100 UNIT/ML ~~LOC~~ SOLN
0.0000 [IU] | SUBCUTANEOUS | Status: DC
  Administered 2019-09-02 (×4): 4 [IU] via SUBCUTANEOUS
  Administered 2019-09-02: 7 [IU] via SUBCUTANEOUS
  Administered 2019-09-03 – 2019-09-04 (×6): 4 [IU] via SUBCUTANEOUS
  Administered 2019-09-04: 7 [IU] via SUBCUTANEOUS
  Administered 2019-09-04: 4 [IU] via SUBCUTANEOUS
  Administered 2019-09-04 (×2): 7 [IU] via SUBCUTANEOUS

## 2019-09-02 MED ORDER — CLONAZEPAM 1 MG PO TABS
2.0000 mg | ORAL_TABLET | Freq: Two times a day (BID) | ORAL | Status: DC
  Administered 2019-09-02 – 2019-09-04 (×5): 2 mg
  Filled 2019-09-02 (×5): qty 2

## 2019-09-02 MED ORDER — OXYCODONE HCL 5 MG/5ML PO SOLN
10.0000 mg | Freq: Four times a day (QID) | ORAL | Status: DC
  Administered 2019-09-02 – 2019-09-04 (×10): 10 mg
  Filled 2019-09-02 (×10): qty 10

## 2019-09-02 NOTE — Progress Notes (Signed)
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  History of present illness   48 year old male patient without significant medical history other than IV drug use.  Presented to the emergency room on 10/21 with chief complaint of about 3-day history of pleuritic type chest discomfort, cough with occasional streaky hemoptysis and progressive shortness of breath.  He also reported intermittent fever and myalgia.  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  In the emergency room he was found to be tachycardic and tachypneic meeting SIRS parameters.  Lactic acid was slightly elevated at 2.8, white blood cell count 50,000, COVID-19 negative.  CT chest was obtained after abnormal chest x-ray evaluated this demonstrated multifocal patchy opacities he was admitted with a working diagnosis of sepsis.  He was administered IV fluids, supplemental oxygen, cultures were obtained and empiric antibiotics started.  Not long after hospital admission, early in the morning on 10/22 Rapid response call was made.  The patient had become progressively hypotensive requiring repeat fluid challenge his temperature spiked to 102.4 and he remained tachycardic.  He had a elevated D-dimer and because of this a CT angiogram was obtained this showed progression of bilateral pleural effusions particularly on the right with the right effusion appearing more complicated in nature, he also had what appeared to be multifocal cavitary areas of consolidation consistent with septic emboli in addition to small pericardial effusion.  Later that morning his blood cultures were found to be positive with initial study suggesting MRSA.  He  continued to decline with worsening work of breathing and because of this critical care was asked to evaluate.  Past Medical History  No significant medical history other than IV drug abuse  Significant Hospital Events   10/21 presented to the emergency room with chief complaint of shortness of breath and chest pain  Consults:  Critical  care consulted 10/22 Infectious disease 10/22 Card 10/22 Procedures:  10/22: Intubated  Significant Diagnostic Tests:  CT chest (angiogram) 10/22: 1. Multifocal cavitary pneumonia consistent with septic emboli. No emboli large enough for visualization by CTA. 2. Small pleural effusions that are complex and increased from yesterday, right larger than left. 3. Small pericardial effusion with possible pericarditis. TTE 10/22>>>LVEF 60-65% no overt vegetations 10/22 RUQ u/s: Unremarkable right upper quadrant ultrasound. Right pleural effusion similar to that seen on prior exam.  Micro Data:  RVP 10/22: rhinovirus/enterovirus covid 19 X 2 (on 10/21 and 10/22) both negative Blood 10/21: +MRSA 10/23 blood: + GPC  Antimicrobials:  vanc 10/21-> Cefepime 10/21>>10/23  Interim history/subjective:  10/24: tmax 100.1. desat early this am with some mucous plugging noted on exam and suction. Improved after lavage.   Objective   Blood pressure 119/81, pulse 87, temperature 98.4 F (36.9 C), temperature source Oral, resp. rate (!) 24, height 5\' 8"  (1.727 m), weight 91.8 kg, SpO2 90 %.    Vent Mode: PRVC FiO2 (%):  [50 %-60 %] 50 % Set Rate:  [18 bmp] 18 bmp Vt Set:  [430 mL] 430 mL PEEP:  [5 cmH20] 5 cmH20 Plateau Pressure:  [15 cmH20-22 cmH20] 22 cmH20   Intake/Output Summary (Last 24 hours) at 09/02/2019 0737 Last data filed at 09/02/2019 0630 Gross  per 24 hour  Intake 3302.18 ml  Output 3532 ml  Net -229.82 ml   Filed Weights   08/30/19 1500 09/01/19 0500 09/02/19 0500  Weight: 86.2 kg 90.9 kg 91.8 kg    Examination: General: adult male  intubated and sedated HENT: mild scleral edema. +icterus. Moist mucous membranes Lungs: scattered rhonchi with thick beige/pink secretions via ETT Cardiovascular: SR on telemetry. No murmur Abdomen: soft, NT, ND Extremities: Warm and well perfused Neuro: RASS -4, pupils 2 and reactive bilaterally. + cough.    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   Mild lactic acidosis -improving  Pericarditis:  Clinical symptoms, ekg and effusion on echo Plan: -Colchicine  Rhinovirus/enterovirus Plan: -Supportive care  Pain w/ h/o IVDA.  -could be component of withdrawal Plan: -Propofol and Fentanyl drip for ETT tolerance  Constipation:  -Docusate and senna scheduled.   Normocytic anemia:  Plan: -Goal Hgb > 7, no tx indicated  Shock liver with hyperbilirubinemia -Improving -bili lagging -ruq u/s negative  Acute kidney injury Improving Plan: -Continue supportive care, avoid nephrotoxins -appears euvolemic  Hyperglycemia hgb a1c 5.1 Plan: SSI   Best practice:  Diet: Tube feeds Pain/Anxiety/Delirium protocol (if indicated): Propofol and fentanyl drip VAP protocol (if indicated): Y DVT prophylaxis: heparin [place] GI prophylaxis: PPI Glucose control: SSI Mobility: BR Code Status: Full code  Family Communication: daily with fiancee Disposition: ICU  Labs   CBC: Recent Labs  Lab Jul 02, 2019 1838 08/30/19 0249  08/31/19 0254  08/31/19 0512 08/31/19 0527 09/01/19 0306 09/02/19 0501  WBC 23.8* 22.4*  --   --   --  22.4* 19.6* 22.1*  NEUTROABS 20.2*  --   --   --   --   --   --   --   HGB 15.1 15.1   < > 13.3 11.2* 12.0* 11.5* 12.1*  HCT 43.8 42.9   < > 39.0 33.0* 35.9* 33.1* 37.0*  MCV 90.5 89.9  --   --   --  93.0 93.0 94.4  PLT 281 247  --   --   --  248 216 194   < > = values in this interval not displayed.    Basic Metabolic Panel: Recent Labs  Lab 08/30/19 0249  08/30/19 1229  08/31/19 0254 08/31/19 0512 08/31/19 0527 08/31/19 1150 09/01/19 0306 09/02/19 0259  NA 130*   < > 134*   < > 137 139 137  --  143 143  K 5.1   < > 4.5   < > 5.1 4.9 5.2* 4.5 4.5 5.6*  CL 95*  --  103  --   --   --  109  --  115* 116*  CO2 20*  --  18*  --   --   --  17*  --  19* 18*  GLUCOSE 102*  --  169*  --   --   --  155*  --  210* 220*  BUN 35*  --  33*  --   --   --  37*  --  36* 39*  CREATININE 1.85*  --  1.59*  --   --   --  1.60*  --  1.14 0.98  CALCIUM 8.5*  --  8.1*  --   --   --  8.2*  --  8.6* 8.3*  MG  --   --   --   --   --   --  1.7  --  2.2 2.1  PHOS  --   --   --   --   --   --  4.7*  --   --  3.3   < > = values in this interval not displayed.   GFR: Estimated Creatinine Clearance: 101.4 mL/min (by C-G formula based on SCr of 0.98 mg/dL). Recent Labs  Lab 08/30/19 0249  08/30/19 1000 08/30/19 1505 08/31/19 0527 08/31/19 1150 09/01/19 0306 09/02/19 0501  PROCALCITON 7.65  --   --   --   --   --   --   --   WBC 22.4*  --   --   --  22.4*  --  19.6* 22.1*  LATICACIDVEN 3.2*   < > 3.3* 3.7* 2.2* 2.0*  --   --    < > = values in this interval not displayed.    Liver Function Tests: Recent Labs  Lab 08/30/19 0249 08/30/19 1229 08/31/19 0527 09/01/19 0306 09/02/19 0259  AST 68* 67* 55* 42* 61*  ALT 57* 51* 37 30 33  ALKPHOS 111 94 79 89 105  BILITOT 4.8* 4.4* 3.9* 3.5* 3.8*  PROT 7.2 6.4* 6.0* 5.7* 5.4*  ALBUMIN 2.2* 2.1* 2.5* 1.9* 1.6*   Recent Labs  Lab 2019-09-06 1838  LIPASE  173*   No results for input(s): AMMONIA in the last 168 hours.  ABG    Component Value Date/Time   PHART 7.354 08/31/2019 0512   PCO2ART 37.0 08/31/2019 0512   PO2ART 204.0 (H) 08/31/2019 0512   HCO3 20.7 08/31/2019 0512   TCO2 22 08/31/2019 0512   ACIDBASEDEF 4.0 (H) 08/31/2019 0512   O2SAT 100.0 08/31/2019 0512     Coagulation Profile: Recent Labs  Lab September 06, 2019 1838  INR 1.4*    Cardiac Enzymes: No results for input(s): CKTOTAL, CKMB, CKMBINDEX, TROPONINI in the last 168 hours.  HbA1C: Hgb A1c MFr Bld  Date/Time Value Ref Range Status  09/01/2019 03:06 AM 5.1 4.8 - 5.6 % Final    Comment:    (NOTE) Pre diabetes:          5.7%-6.4% Diabetes:              >6.4% Glycemic control for   <7.0% adults with diabetes     CBG: Recent Labs  Lab 09/01/19 1104 09/01/19 1503 09/01/19 1920 09/01/19 2349 09/02/19 0341  GLUCAP 211* 188* 174* 191* 200*     Critical care time: The patient is critically ill with multiple organ systems failure and requires high complexity decision making for assessment and support, frequent evaluation and titration of therapies, application of advanced monitoring technologies and extensive interpretation of multiple databases.  Critical care time 39 mins. This represents my time independent of the NPs time taking care of the pt. This is excluding procedures.    Audria Nine DO After hours pager: [phone]  [facility] 09/02/2019, 7:37 AM

## 2019-09-02 NOTE — Progress Notes (Signed)
Place Foley for urinary retention  Has needed to be catheterized up to 4 times for over 400 cc of urine

## 2019-09-02 NOTE — Progress Notes (Signed)
[facility] Progress Note Patient Name: Scott Strong DOB: 24-Nov-1970 MRN: [id number]   Date of Service  09/02/2019  HPI/Events of Note  Oliguria - Bladder scan with 678 mL residual.   eICU Interventions  Will order: 1. I/O cath PRN.      Intervention Category Major Interventions: Other:  Lysle Dingwall 09/02/2019, 12:31 AM

## 2019-09-02 NOTE — Progress Notes (Signed)
Notified Dr. Ander Slade CCM of patients urinary retention. This is the 4th bladder scan previous one obtained at 1115 with volumes greater then 400. Straight cathed at this time. Orders to place urinary foley for recent bladder scan of 404 ml.

## 2019-09-02 NOTE — Progress Notes (Signed)
RT called to room for desaturation. Pt with very diminished bbs, and RT unable to pass suction catheter, pt bag/lavaged returning moderate amount of thick/pinkish secretions. RT able to pass suction catheter post suctioning, and SpO2 is currently around 95%. RT will continue to monitor.

## 2019-09-03 ENCOUNTER — Inpatient Hospital Stay (HOSPITAL_COMMUNITY): Payer: Medicaid Other

## 2019-09-03 DIAGNOSIS — I2690 Septic pulmonary embolism without acute cor pulmonale: Secondary | ICD-10-CM

## 2019-09-03 DIAGNOSIS — R7881 Bacteremia: Secondary | ICD-10-CM

## 2019-09-03 DIAGNOSIS — F1990 Other psychoactive substance use, unspecified, uncomplicated: Secondary | ICD-10-CM

## 2019-09-03 DIAGNOSIS — F1120 Opioid dependence, uncomplicated: Secondary | ICD-10-CM

## 2019-09-03 DIAGNOSIS — Z9689 Presence of other specified functional implants: Secondary | ICD-10-CM

## 2019-09-03 DIAGNOSIS — I313 Pericardial effusion (noninflammatory): Secondary | ICD-10-CM

## 2019-09-03 LAB — POCT I-STAT 7, (LYTES, BLD GAS, ICA,H+H)
Acid-base deficit: 2 mmol/L (ref 0.0–2.0)
Bicarbonate: 22.1 mmol/L (ref 20.0–28.0)
Calcium, Ion: 1.24 mmol/L (ref 1.15–1.40)
HCT: 42 % (ref 39.0–52.0)
Hemoglobin: 14.3 g/dL (ref 13.0–17.0)
O2 Saturation: 97 %
Patient temperature: 98.5
Potassium: 5.3 mmol/L — ABNORMAL HIGH (ref 3.5–5.1)
Sodium: 149 mmol/L — ABNORMAL HIGH (ref 135–145)
TCO2: 23 mmol/L (ref 22–32)
pCO2 arterial: 36.9 mmHg (ref 32.0–48.0)
pH, Arterial: 7.386 (ref 7.350–7.450)
pO2, Arterial: 94 mmHg (ref 83.0–108.0)

## 2019-09-03 LAB — COMPREHENSIVE METABOLIC PANEL
ALT: 39 U/L (ref 0–44)
AST: 58 U/L — ABNORMAL HIGH (ref 15–41)
Albumin: 1.4 g/dL — ABNORMAL LOW (ref 3.5–5.0)
Alkaline Phosphatase: 123 U/L (ref 38–126)
Anion gap: 9 (ref 5–15)
BUN: 50 mg/dL — ABNORMAL HIGH (ref 6–20)
CO2: 20 mmol/L — ABNORMAL LOW (ref 22–32)
Calcium: 8.4 mg/dL — ABNORMAL LOW (ref 8.9–10.3)
Chloride: 119 mmol/L — ABNORMAL HIGH (ref 98–111)
Creatinine, Ser: 0.99 mg/dL (ref 0.61–1.24)
GFR calc Af Amer: >60 mL/min (ref >60)
GFR calc non Af Amer: >60 mL/min (ref >60)
Glucose, Bld: 190 mg/dL — ABNORMAL HIGH (ref 70–99)
Potassium: 5.3 mmol/L — ABNORMAL HIGH (ref 3.5–5.1)
Sodium: 148 mmol/L — ABNORMAL HIGH (ref 135–145)
Total Bilirubin: 3 mg/dL — ABNORMAL HIGH (ref 0.3–1.2)
Total Protein: 5.9 g/dL — ABNORMAL LOW (ref 6.5–8.1)

## 2019-09-03 LAB — CBC
HCT: 35.5 % — ABNORMAL LOW (ref 39.0–52.0)
Hemoglobin: 11.9 g/dL — ABNORMAL LOW (ref 13.0–17.0)
MCH: 31.4 pg (ref 26.0–34.0)
MCHC: 33.5 g/dL (ref 30.0–36.0)
MCV: 93.7 fL (ref 80.0–100.0)
Platelets: 198 10*3/uL (ref 150–400)
RBC: 3.79 MIL/uL — ABNORMAL LOW (ref 4.22–5.81)
RDW: 15.3 % (ref 11.5–15.5)
WBC: 18 10*3/uL — ABNORMAL HIGH (ref 4.0–10.5)
nRBC: 0.2 % (ref 0.0–0.2)

## 2019-09-03 LAB — GLUCOSE, CAPILLARY
Glucose-Capillary: 182 mg/dL — ABNORMAL HIGH (ref 70–99)
Glucose-Capillary: 160 mg/dL — ABNORMAL HIGH (ref 70–99)
Glucose-Capillary: 154 mg/dL — ABNORMAL HIGH (ref 70–99)
Glucose-Capillary: 102 mg/dL — ABNORMAL HIGH (ref 70–99)
Glucose-Capillary: 162 mg/dL — ABNORMAL HIGH (ref 70–99)
Glucose-Capillary: 194 mg/dL — ABNORMAL HIGH (ref 70–99)

## 2019-09-03 LAB — TRIGLYCERIDES: Triglycerides: 165 mg/dL — ABNORMAL HIGH (ref <150)

## 2019-09-03 LAB — MAGNESIUM: Magnesium: 2.1 mg/dL (ref 1.7–2.4)

## 2019-09-03 LAB — ECHOCARDIOGRAM LIMITED
Height: 68 in
Weight: 3280.44 oz

## 2019-09-03 MED ORDER — LIDOCAINE HCL (PF) 1 % IJ SOLN
INTRAMUSCULAR | Status: AC
  Filled 2019-09-03: qty 5

## 2019-09-03 MED ORDER — FREE WATER
200.0000 mL | Freq: Four times a day (QID) | Status: DC
  Administered 2019-09-03 – 2019-09-04 (×5): 200 mL

## 2019-09-03 MED ORDER — SODIUM CHLORIDE 0.9 % IV BOLUS
1000.0000 mL | Freq: Once | INTRAVENOUS | Status: AC
  Administered 2019-09-03: 12:00:00 1000 mL via INTRAVENOUS

## 2019-09-03 MED ORDER — MIDAZOLAM BOLUS VIA INFUSION
4.0000 mg | Freq: Once | INTRAVENOUS | Status: AC
  Administered 2019-09-03: 4 mg via INTRAVENOUS

## 2019-09-03 MED ORDER — VECURONIUM BROMIDE 10 MG IV SOLR
10.0000 mg | Freq: Once | INTRAVENOUS | Status: AC
  Administered 2019-09-03: 10 mg via INTRAVENOUS
  Filled 2019-09-03: qty 10

## 2019-09-03 MED ORDER — METHYLPREDNISOLONE SODIUM SUCC 125 MG IJ SOLR
40.0000 mg | Freq: Two times a day (BID) | INTRAMUSCULAR | Status: DC
  Administered 2019-09-03 – 2019-09-04 (×4): 40 mg via INTRAVENOUS
  Filled 2019-09-03 (×4): qty 2

## 2019-09-03 MED ORDER — SENNOSIDES 8.8 MG/5ML PO SYRP
5.0000 mL | ORAL_SOLUTION | Freq: Two times a day (BID) | ORAL | Status: DC
  Administered 2019-09-03 – 2019-09-04 (×2): 5 mL
  Filled 2019-09-03 (×2): qty 5

## 2019-09-03 MED ORDER — MIDAZOLAM BOLUS VIA INFUSION
2.0000 mg | INTRAVENOUS | Status: DC | PRN
  Administered 2019-09-04: 05:00:00 2 mg via INTRAVENOUS
  Filled 2019-09-03: qty 2

## 2019-09-03 MED ORDER — SODIUM POLYSTYRENE SULFONATE 15 GM/60ML PO SUSP
15.0000 g | Freq: Once | ORAL | Status: AC
  Administered 2019-09-03: 15 g via ORAL
  Filled 2019-09-03: qty 60

## 2019-09-03 MED ORDER — SODIUM CHLORIDE 0.9 % IV SOLN: INTRAVENOUS | Status: DC | PRN

## 2019-09-03 MED ORDER — MIDAZOLAM 50MG/50ML (1MG/ML) PREMIX INFUSION
0.5000 mg/h | INTRAVENOUS | Status: DC
  Administered 2019-09-03: 13:00:00 10 mg/h via INTRAVENOUS
  Administered 2019-09-03: 4 mg/h via INTRAVENOUS
  Administered 2019-09-03 – 2019-09-04 (×3): 10 mg/h via INTRAVENOUS
  Administered 2019-09-04: 14:00:00 8 mg/h via INTRAVENOUS
  Administered 2019-09-04: 10 mg/h via INTRAVENOUS
  Filled 2019-09-03 (×8): qty 50

## 2019-09-03 MED ORDER — DOCUSATE SODIUM 50 MG/5ML PO LIQD
200.0000 mg | Freq: Two times a day (BID) | ORAL | Status: DC
  Administered 2019-09-03 – 2019-09-04 (×2): 200 mg
  Filled 2019-09-03 (×2): qty 20

## 2019-09-03 MED ORDER — PANTOPRAZOLE SODIUM 40 MG PO PACK
40.0000 mg | PACK | Freq: Every day | ORAL | Status: DC
  Administered 2019-09-03 – 2019-09-04 (×2): 40 mg
  Filled 2019-09-03 (×3): qty 20

## 2019-09-03 MED ORDER — VANCOMYCIN HCL 10 G IV SOLR
1250.0000 mg | Freq: Two times a day (BID) | INTRAVENOUS | Status: DC
  Administered 2019-09-03 (×2): 1250 mg via INTRAVENOUS
  Filled 2019-09-03 (×4): qty 1250

## 2019-09-03 NOTE — Progress Notes (Signed)
Pharmacy Antibiotic Note  Scott Strong is a 48 y.o. 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 Pharmacy consulted for vancomycin dosing.  Now with new pneumothorax s/p chest tube placement.  AKI resolving, Tmax 100.8, WBC down 18.  Plan: Continue vanc 1500mg  IV Q24H Monitor renal fxn, clinical progress, vanc AUC this week F/U TEE, repeat BCx   Height: 5\' 8"  (172.7 cm) Weight: 205 lb 0.4 oz (93 kg) IBW/kg (Calculated) : 68.4  Temp (24hrs), Avg:99.4 F (37.4 C), Min:98.4 F (36.9 C), Max:100.8 F (38.2 C)  Recent Labs  Lab 08/30/19 0249 08/30/19 0727 08/30/19 1000 08/30/19 1229 08/30/19 1505 08/31/19 0527 08/31/19 1150 09/01/19 0306 09/02/19 0259 09/02/19 0501 09/03/19 0317  WBC 22.4*  --   --   --   --  22.4*  --  19.6*  --  22.1* 18.0*  CREATININE 1.85*  --   --  1.59*  --  1.60*  --  1.14 0.98  --  0.99  LATICACIDVEN 3.2* 2.8* 3.3*  --  3.7* 2.2* 2.0*  --   --   --   --     Estimated Creatinine Clearance: 100.9 mL/min (by C-G formula based on SCr of 0.99 mg/dL).    No Known Allergies   Vanc 10/22>> Cefepime 10/22>>10/22 CTX x 1 10/21 Azithro x 1 10/21  10/21 Blood >> 2/2 GPC in clusters, MRSA on BCID 10/21 UCx - MRSA 10/22 RVP - +rhinovirus/enterovirus 10/22 COVID >> neg 10/22 MRSA PCR >> neg 10/22 RCx >> MRSA 10/23 BCx >>MRSA  Scott Strong D. Mina Marble, PharmD, BCPS, [place] 09/03/2019, 7:36 AM

## 2019-09-03 NOTE — Progress Notes (Signed)
[facility] Progress Note Patient Name: Scott Strong DOB: Mar 13, 1971 MRN: [id number]   Date of Service  09/03/2019  HPI/Events of Note  R Pneumothorax - Notified by Respiratory Therapy of O2 desaturation. Review of AM CXR --> R pneumothorax. Ppeak = 34 (PC = 26 and PEEP = 8).  eICU Interventions  Will order: 1. Decrease control pressure to 22 which should reduce Ppeak to 30. 2. Will notify ground team of need for R chest tube placement.      Intervention Category Major Interventions: Hypoxemia - evaluation and management  Jedd Schulenburg [place] 09/03/2019, 6:14 AM

## 2019-09-03 NOTE — Progress Notes (Signed)
RT NOTES: Called to patient's room by RN, patient desaturating to 79%. Patient taken off vent and manually bagged with 100% O2 with PEEP valve set to +10. Sats now 90%. PEEP on vent increased to +10 per MD. Will continue to monitor.

## 2019-09-03 NOTE — Progress Notes (Signed)
Progress Note  Patient Name: Scott Strong Date of Encounter: 09/03/2019  Primary Cardiologist: Ena Dawley, MD   Subjective  O/N events: Foley placed for urinary retention  Scott Strong was examined and evaluated at bedside this AM. He is currently intubated and sedated. Does not respond to verbal or painful stimuli  Inpatient Medications    Scheduled Meds: . chlorhexidine gluconate (MEDLINE KIT)  15 mL Mouth Rinse BID  . Chlorhexidine Gluconate Cloth  6 each Topical Q0600  . clonazePAM  2 mg Per Tube Q12H  . colchicine  0.6 mg Oral BID  . docusate  100 mg Per Tube BID  . heparin injection (subcutaneous)  5,000 Units Subcutaneous Q8H  . hydrocortisone sod succinate (SOLU-CORTEF) inj  50 mg Intravenous Q6H  . insulin aspart  0-20 Units Subcutaneous Q4H  . ipratropium-albuterol  3 mL Nebulization QID  . lidocaine (PF)      . lidocaine (PF)      . mouth rinse  15 mL Mouth Rinse 10 times per day  . mupirocin ointment  1 application Nasal BID  . oxyCODONE  10 mg Per Tube Q6H  . pantoprazole (PROTONIX) IV  40 mg Intravenous Daily  . QUEtiapine  50 mg Per Tube Q12H  . senna-docusate  1 tablet Per Tube BID   Continuous Infusions: . feeding supplement (VITAL HIGH PROTEIN) 1,000 mL (09/03/19 0326)  . fentaNYL infusion INTRAVENOUS 250 mcg/hr (09/03/19 9211)  . norepinephrine (LEVOPHED) Adult infusion 2.667 mcg/min (09/03/19 0500)  . propofol (DIPRIVAN) infusion 10 mcg/kg/min (09/03/19 0539)  . vancomycin Stopped (09/02/19 1058)   PRN Meds: acetaminophen **OR** acetaminophen, fentaNYL, metoprolol tartrate, midazolam   Vital Signs    Vitals:   09/03/19 0337 09/03/19 0358 09/03/19 0400 09/03/19 0500  BP:   96/69 94/68  Pulse: (!) 113  (!) 116 (!) 113  Resp: (!) 32  (!) 30 (!) 25  Temp:  98.5 F (36.9 C)    TempSrc:  Oral    SpO2: 96%  92% 94%  Weight:      Height:        Intake/Output Summary (Last 24 hours) at 09/03/2019 9417 Last data filed at 09/03/2019 0500 Gross  per 24 hour  Intake 3003.88 ml  Output 1625 ml  Net 1378.88 ml   Last 3 Weights 09/03/2019 09/02/2019 09/01/2019  Weight (lbs) 205 lb 0.4 oz 202 lb 6.1 oz 200 lb 6.4 oz  Weight (kg) 93 kg 91.8 kg 90.9 kg      Telemetry    Normal sinus with intermittent tachycardia - Personally Reviewed  ECG    No new tracings - Personally Reviewed  Physical Exam   GEN: intubated and sedated Neck: No JVD Cardiac: RRR Respiratory: intubated MS: No edema Neuro:  sedated  Psych: sedated   Labs    High Sensitivity Troponin:   Recent Labs  Lab 08/25/2019 1838 09/08/2019 2300 08/30/19 0249 08/30/19 0427 08/30/19 0727  TROPONINIHS 17 87* 79* 101* 71*      Chemistry Recent Labs  Lab 09/01/19 0306 09/02/19 0259 09/03/19 0317  NA 143 143 148*  K 4.5 5.6* 5.3*  CL 115* 116* 119*  CO2 19* 18* 20*  GLUCOSE 210* 220* 190*  BUN 36* 39* 50*  CREATININE 1.14 0.98 0.99  CALCIUM 8.6* 8.3* 8.4*  PROT 5.7* 5.4* 5.9*  ALBUMIN 1.9* 1.6* 1.4*  AST 42* 61* 58*  ALT 30 33 39  ALKPHOS 89 105 123  BILITOT 3.5* 3.8* 3.0*  GFRNONAA >60 >60 >60  GFRAA >  60 >60 >60  ANIONGAP _0 Hematology Recent Labs  Lab 09/01/19 0306 09/02/19 0501 09/03/19 0317  WBC 19.6* 22.1* 18.0*  RBC 3.56* 3.92* 3.79*  HGB 11.5* 12.1* 11.9*  HCT 33.1* 37.0* 35.5*  MCV 93.0 94.4 93.7  MCH 32.3 30.9 31.4  MCHC 34.7 32.7 33.5  RDW 14.6 15.2 15.3  PLT 216 194 198    BNPNo results for input(s): BNP, PROBNP in the last 168 hours.   DDimer  Recent Labs  Lab 08/30/19 0249  DDIMER 9.44*     Radiology    No results found.  Cardiac Studies   Echo 08/30/19: 1. Left ventricular ejection fraction, by visual estimation, is 60 to 65%. The left ventricle has normal function. There is no left ventricular hypertrophy.  2. Global right ventricle has normal systolic function.The right ventricular size is normal. No increase in right ventricular wall thickness.  3. Left atrial size was normal.  4. Right atrial  size was normal.  5. The mitral valve is grossly normal. Trace mitral valve regurgitation.  6. The tricuspid valve is grossly normal. Tricuspid valve regurgitation is trivial.  7. The aortic valve is tricuspid Aortic valve regurgitation was not visualized by color flow Doppler.  8. The pulmonic valve was grossly normal. Pulmonic valve regurgitation is not visualized by color flow Doppler.  9. Normal pulmonary artery systolic pressure. 10. The inferior vena cava is dilated in size with >50% respiratory variability, suggesting right atrial pressure of 8 mmHg. The IVC collapses. 11. Small pericardial effusion. 12. The pericardial effusion is circumferential. Tamponade is not suspected. 13. No evidence of any gross valvular vegetations.   Patient Profile     48 y.o. 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 Hospital course complicated by pericardial effusion and pneumothorax  Assessment & Plan    1. Sepsis, MRSA bacteremiA Mild troponemia in setting of sepsis. Blood culture w/ MRSA bacteremia. Currently intubated. No pressor requirement. On vanc. Wbc 22.1->18 - Management per PCCM  2. Pneuomothorax Chest tube placed for pneumothorax yesterday. Resolving pneumo on reepat X-ray. Currently stable. - C/w chest tube - Daily X-ray  3. Pericardial effusion 08/30/19 Echocardiogram showing pericardial effusion w/o tamponade. Vitals stable. BP stable. Colchicine started last week - C/w colchicine 0.46m BID - Plan for repeat echo to assess size of effusion today  4. AKI Creatinine stable. Good urinary output at 1.6L overnight. - Trend renal fx - Avoid nephrotoxic meds when able  For questions or updates, please contact [place]Please consult [email or web address] for contact info under     Signed, JMosetta Anis MD  09/03/2019, 7:12 AM  PGY-2, [place] Pager: [phone] Attending attestation to follow

## 2019-09-03 NOTE — Progress Notes (Signed)
  Echocardiogram 2D Echocardiogram has been performed.  Scott Strong 09/03/2019, 1:49 PM

## 2019-09-03 NOTE — Progress Notes (Signed)
RT was called to the room due to patient desat by RN. Upon arrival, patient seems to be in more distress than usual. Pt is diminished on the right side and has demanded a higher amount of oxygen. Patient is breathing in the mid to high 30's. RT made E-link aware

## 2019-09-03 NOTE — Procedures (Signed)
Chest Tube Insertion Procedure Note  Indications:  Clinically significant Pneumothorax  Pre-operative Diagnosis: Pneumothorax  Post-operative Diagnosis: Pneumothorax  Procedure Details  Informed consent was obtained for the procedure, including sedation.  Risks of lung perforation, hemorrhage, arrhythmia, and adverse drug reaction were discussed.  Given versed 2mg .  After sterile skin prep, using standard technique, a 14 French tube was placed in the right lateral 4th rib space. Used seldinger technique for placement of cook pigtail catheter.    Findings: Serous fluid and air removed.    Estimated Blood Loss:  Minimal         Specimens:  None              Complications:  None; patient tolerated the procedure well.         Disposition: ICU - intubated and critically ill.  Placed to suction, sat immediately improved from low nineties to high nineties, decreased FiO2 from 100% to 90%, sat at 95%.  Still tachypneic in high 20s (improved from high 30s).           Condition: stable  Attending Attestation: I performed the procedure.

## 2019-09-03 NOTE — Progress Notes (Signed)
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  Overnight 10/26 patient seen with increased respiratory distress and increased oxygen requirements prompting CXR that revealed new right pneumothorax requiring placement of pigtail chest tube.   History of present illness   48 year old male patient without significant medical history other than IV drug use.  Presented to the emergency room on 10/21 with chief complaint of about 3-day history of pleuritic type chest discomfort, cough with occasional streaky hemoptysis and progressive shortness of breath.  He also reported intermittent fever and myalgia.  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  In the emergency room he was found to be tachycardic and tachypneic meeting SIRS parameters.  Lactic acid was slightly elevated at 2.8, white blood cell count 50,000, COVID-19 negative.  CT chest was obtained after abnormal chest x-ray evaluated this demonstrated multifocal patchy opacities he was admitted with a working diagnosis of sepsis.  He was administered IV fluids, supplemental oxygen, cultures were obtained and empiric antibiotics started.  Not long after hospital admission, early in the morning on 10/22 Rapid response call was made.  The patient had become progressively hypotensive requiring repeat fluid challenge his temperature spiked to 102.4 and he remained tachycardic.  He had a elevated D-dimer and because of this a CT angiogram was obtained this showed progression of bilateral pleural effusions particularly on the right with the right effusion appearing more complicated in nature, he also had what appeared to be multifocal cavitary areas of  consolidation consistent with septic emboli in addition to small pericardial effusion.  Later that morning his blood cultures were found to be positive with initial study suggesting MRSA.  He continued to decline with worsening work of breathing and because of this critical care was asked to evaluate.  Past Medical History  No significant medical history other than IV drug abuse  Significant Hospital Events   10/21 presented to the emergency room with chief complaint of shortness of breath and chest pain  Consults:  Critical  care consulted 10/22 Infectious disease 10/22 Card 10/22 Procedures:  10/22 >> Intubated 10/26 >> Right pigtail chest tube   Significant Diagnostic Tests:  CT chest (angiogram) 10/22: 1. Multifocal cavitary pneumonia consistent with septic emboli. No emboli large enough for visualization by CTA. 2. Small pleural effusions that are complex and increased from yesterday, right larger than left. 3. Small pericardial effusion with possible pericarditis. TTE 10/22>>>LVEF 60-65% no overt vegetations RUQ u/s 10/22: Unremarkable right upper quadrant ultrasound. Right pleural effusion similar to that seen on prior exam. CXR 10/26 >> New ~20% right pneumothorax, worsening bilateral pneumonia    Micro Data:  RVP 10/22 >> rhinovirus/enterovirus covid 19 X 2 (on 10/21 and 10/22) >> both negative Blood culture 10/21 >> +MRSA, resistant to cipro, erythromycin, oxacillin, and Trimeth/sulfa Blood culture 10/23 >> +MRSA Sputum culture 10/22 >> +MRSA  Antimicrobials:  Vanc 10/21>> Cefepime 10/21 >> 10/23  Interim history/subjective:  Overnight 10/26 patient seen with increased respiratory distress and increased oxygen requirements prompting CXR that revealed new right pneumothorax requiring placement of pigtail chest tube. Also required placement if foley catheter due to retention.   Objective   Blood pressure 101/61, pulse (!) 116,  temperature 98.5 F (36.9 C), temperature  source Oral, resp. rate (!) 35, height 5\' 8"  (1.727 m), weight 93 kg, SpO2 95 %.    Vent Mode: PCV FiO2 (%):  [60 %-100 %] 90 % Set Rate:  [18 bmp-22 bmp] 22 bmp Vt Set:  [430 mL-540 mL] 540 mL PEEP:  [5 cmH20-8 cmH20] 8 cmH20 Plateau Pressure:  [24 cmH20-32 cmH20] 28 cmH20   Intake/Output Summary (Last 24 hours) at 09/03/2019 0741 Last data filed at 09/03/2019 0500 Gross per 24 hour  Intake 3003.88 ml  Output 1625 ml  Net 1378.88 ml   Filed Weights   09/01/19 0500 09/02/19 0500 09/03/19 0327  Weight: 90.9 kg 91.8 kg 93 kg    Examination: General: Ill appearing adult male on mechanical ventilation, in mild respiratory distress post chest tube insertion  HEENT: ETT, MM pink/moist, PERRL,  Neuro: Sedated on ventilatory, unable to determine mentation  CV: Sinus tachycardia, regular rate and rhythm, no murmur, rubs, or gallops,  PULM:  Course rhonchi bilaterally with mild increased work of breathing  GI: soft, bowel sounds active in all 4 quadrants, non-tender, non-distended, tolerating TF Extremities: warm/dry, no edema  Skin: no rashes or lesions  Assessment and plan   Severe sepsis with septic shock 2/2 MRSA bacteremia w/ 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 Head of bed elevated 30 degrees. Plateau pressures less than 30 cm H20.  Follow intermittent chest x-ray and ABG.   SAT/SBT as tolerated,  tachypnea and oxygen requirement excludes SBT Mentation preclude extubation  Ensure adequate pulmonary hygiene  Follow cultures  VAP bundle in place   Mild lactic acidosis -improving Plan: Continue treatment for MRSA bacteremia as above   Pericarditis:  Clinical symptoms, ekg and effusion on echo Plan: Continue Colchicine  Consider repeat TTE per cardiology   Rhinovirus/enterovirus Plan: Supportive care   Pain w/ h/o IVDA.  -could be component of withdrawal Plan: Continue Fentanyl and Propofol drip for ventilator tolerance Wean sedation as able   Constipation:  Continue bowel regiment   Normocytic anemia:  Plan: Transfuse per protocol  Goal of Hgb > 7  Shock liver with hyperbilirubinemia -Improving -bili lagging -ruq u/s negative Plan: Avoid hepatotoxins Trend liver panel   Acute kidney injury Urinary retention  Improving Plan: Continue to follow renal function / urine output Trend Bmet Avoid nephrotoxins, ensure adequate renal perfusion  Continue foley cath   Hyperglycemia hgb a1c 5.1 Plan: SSI ACHS CBG checks Hypoglycemia protocol   Hypernatremia/Hyperchloridemia  -NA 149 slow trend up Plan:  Free water flushes per OG Trend CMP daily   Best practice:  Diet: Tube feed with free water flush  Pain/Anxiety/Delirium protocol (if indicated): Propofol and fentanyl drip VAP protocol (if indicated): in place DVT prophylaxis: heparin [place] GI prophylaxis: PPI Glucose control: SSI Mobility: BR Code Status: Full code  Family Communication: daily with fiancee Disposition: ICU  Labs   CBC: Recent Labs  Lab 08/30/2019 1838 08/30/19 0249  08/31/19 [id number]  08/31/19 0527 09/01/19 0306 09/02/19 0501 09/03/19 0317  WBC 23.8* 22.4*  --   --  22.4* 19.6* 22.1* 18.0*  NEUTROABS 20.2*  --   --   --   --   --   --   --   HGB 15.1 15.1   < > 11.2* 12.0* 11.5* 12.1* 11.9*  HCT 43.8 42.9   < > 33.0* 35.9* 33.1* 37.0* 35.5*  MCV 90.5 89.9  --   --  93.0 93.0  94.4 93.7  PLT 281 247  --   --  248 216 194 198   < > = values in this interval not displayed.    Basic Metabolic Panel: Recent Labs  Lab 08/30/19 1229  08/31/19 0512 08/31/19 0527 08/31/19 1150 09/01/19 0306 09/02/19 0259 09/03/19 0317  NA 134*   < > 139 137  --  143 143 148*  K 4.5   < > 4.9 5.2* 4.5 4.5 5.6* 5.3*  CL 103  --   --  109  --  115* 116* 119*  CO2 18*  --   --  17*  --  19* 18* 20*  GLUCOSE 169*  --   --  155*  --  210* 220* 190*  BUN 33*  --   --  37*  --  36* 39* 50*  CREATININE 1.59*  --   --  1.60*  --  1.14 0.98 0.99  CALCIUM 8.1*  --   --  8.2*  --  8.6* 8.3* 8.4*  MG  --   --   --  1.7  --  2.2 2.1 2.1  PHOS  --   --   --  4.7*  --   --  3.3  --    < > = values in this interval not displayed.   GFR: Estimated Creatinine Clearance: 100.9 mL/min (by C-G formula based on SCr of 0.99 mg/dL). Recent Labs  Lab 08/30/19 0249  08/30/19 1000 08/30/19 1505 08/31/19 0527 08/31/19 1150 09/01/19 0306 09/02/19 0501 09/03/19 0317  PROCALCITON 7.65  --   --   --   --   --   --   --   --   WBC 22.4*  --   --   --  22.4*  --  19.6* 22.1* 18.0*  LATICACIDVEN 3.2*   < > 3.3* 3.7* 2.2* 2.0*  --   --   --    < > = values in this interval not displayed.    Liver Function Tests: Recent Labs  Lab 08/30/19 1229 08/31/19 0527 09/01/19 0306 09/02/19 0259 09/03/19 0317  AST 67* 55* 42* 61* 58*  ALT 51* 37 30 33 39  ALKPHOS 94 79 89 105 123  BILITOT 4.4* 3.9* 3.5* 3.8* 3.0*  PROT 6.4* 6.0* 5.7* 5.4* 5.9*  ALBUMIN 2.1* 2.5* 1.9* 1.6* 1.4*   Recent Labs  Lab 08/31/2019 1838  LIPASE 173*   No results for input(s): AMMONIA in the last 168 hours.  ABG    Component Value Date/Time   PHART 7.354 08/31/2019 0512   PCO2ART 37.0 08/31/2019 0512   PO2ART 204.0 (H) 08/31/2019 0512   HCO3 20.7 08/31/2019 0512   TCO2 22 08/31/2019 0512   ACIDBASEDEF 4.0 (H) 08/31/2019 0512   O2SAT 100.0 08/31/2019 0512     Coagulation Profile: Recent Labs  Lab 09/02/2019 1838    INR 1.4*    Cardiac Enzymes: No results for input(s): CKTOTAL, CKMB, CKMBINDEX, TROPONINI in the last 168 hours.  HbA1C: Hgb A1c MFr Bld  Date/Time Value Ref Range Status  09/01/2019 03:06 AM 5.1 4.8 - 5.6 % Final    Comment:    (NOTE) Pre diabetes:          5.7%-6.4% Diabetes:              >6.4% Glycemic control for   <7.0% adults with diabetes     CBG: Recent Labs  Lab 09/02/19 1122 09/02/19 1548 09/02/19 1948 09/02/19 2340 09/03/19 0356  GLUCAP 187* 168* 182* 211* 182*     Critical care time:   CRITICAL CARE Performed by: Johnsie Cancel   Total critical care time: 40 minutes  Critical care time was exclusive of separately billable procedures and treating other patients.  Critical care was necessary to treat or prevent imminent or life-threatening deterioration.  Critical care was time spent personally by me on the following activities: development of treatment plan with patient and/or surrogate as well as nursing, discussions with consultants, evaluation of patient's response to treatment, examination of patient, obtaining history from patient or surrogate, ordering and performing treatments and interventions, ordering and review of laboratory studies, ordering and review of radiographic studies, pulse oximetry and re-evaluation of patient's condition.   Signature:    Johnsie Cancel, NP-C [facility] Contact information can be found on Amion  After hours pager: [phone]. 09/03/2019, 8:20 AM  '

## 2019-09-03 NOTE — Progress Notes (Signed)
Sister, Verdie Drown called the unit requesting updates. Dr. Ruthann Cancer attempted to call Jocelyn earlier this afternoon but no answer due to work. Will pass onto night RN for Dr. Ruthann Cancer to try calling her again tomorrow.   Dewaine Oats, RN

## 2019-09-03 NOTE — Progress Notes (Signed)
Pharmacy Antibiotic Note  Scott Strong is a 48 y.o. male admitted on 08/26/2019 with MRSA bacteremia and concern for endocarditis with pulmonary septic emboli on chest CT. Pharmacy consulted for vancomycin dosing.  Now with new pneumothorax s/p chest tube placement.  AKI resolving, Tmax 100.8, WBC down 18.   Plan: Given AKI resolved will empirically adjust his Vanc and check a level with the new regimen Vancomycin 1250 q12h for a predicted AUC of 480 Scr 0.99 Monitor renal fxn, clinical progress, vanc AUC this week F/U TEE, repeat BCx   Height: 5\' 8"  (172.7 cm) Weight: 205 lb 0.4 oz (93 kg) IBW/kg (Calculated) : 68.4  Temp (24hrs), Avg:99.3 F (37.4 C), Min:98.4 F (36.9 C), Max:100.8 F (38.2 C)  Recent Labs  Lab 08/30/19 0249 08/30/19 0727 08/30/19 1000 08/30/19 1229 08/30/19 1505 08/31/19 0527 08/31/19 1150 09/01/19 0306 09/02/19 0259 09/02/19 0501 09/03/19 0317  WBC 22.4*  --   --   --   --  22.4*  --  19.6*  --  22.1* 18.0*  CREATININE 1.85*  --   --  1.59*  --  1.60*  --  1.14 0.98  --  0.99  LATICACIDVEN 3.2* 2.8* 3.3*  --  3.7* 2.2* 2.0*  --   --   --   --     Estimated Creatinine Clearance: 100.9 mL/min (by C-G formula based on SCr of 0.99 mg/dL).    No Known Allergies   Vanc 10/22>> Cefepime 10/22>>10/22 CTX x 1 10/21 Azithro x 1 10/21  10/21 Blood >> 2/2 GPC in clusters, MRSA on BCID 10/21 UCx - MRSA 10/22 RVP - +rhinovirus/enterovirus 10/22 COVID >> neg 10/22 MRSA PCR >> neg 10/22 RCx >> MRSA 10/23 BCx >>MRSA  Nicoletta Dress, PharmD PGY2 Infectious Disease Pharmacy Resident  09/03/2019, 10:09 AM

## 2019-09-03 NOTE — Progress Notes (Addendum)
Called emergently to pt's room for desaturations.   Stat cxr revealed interval worsening of bilateral ptx. Pt had recently been mobilized after bm.   -Pt bagged to increase sats from 80's to low 90's.  -increased suction to -40 -given dose of paralytic to see if will help maintain saturations.  -if does then can start continuous paralytic   Updated fiancee via phone.  Pt remains full code.   Attempted to reach only family contact known (sister Verdie Drown) without success.   Update: gave bolus of vec with further desaturations. Will hold gtt.

## 2019-09-03 NOTE — Progress Notes (Signed)
Patient's sister, Jocelyn's phone number is [phone]

## 2019-09-03 NOTE — Progress Notes (Signed)
[place] for Infectious Disease  Date of Admission:  08/23/2019      Total days of antibiotics 6  Vancomycin Day 6    ASSESSMENT: Scott Strong is a 48 y.o. 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 Unfortunately incurred pneumothorax and required chest tube placement - still with intermittent 1 chamber leak with thin/serosanguinous drainage.   Continue vancomycin for bacteremia. Repeated cultures were positive 24h after abx started. Will repeat today to gauge clearance. Difficult to determine other foci of infections at this time given intubated state; initial presentation for chest pain/SOB. Please hold on central line access if possible until he clears. Once he stabilizes would image spine with MRI if possible and check TEE.   Respiratory failure - remains hypoxic on high vent settings. PCCM managing. Repeat CXR revealed new small-mod 20% ptx on the right now s/p CT placement; progression of b/l pneumonia with small effusions. Respiratory viral panel + for rhinovirus.   Pericarditis - viral vs bacterial? EF 10/22 with normal EF 55-60%, no large vegetations seen. Small concentric pericardial effusion. No signs of tamponade on exam today.   Medication monitoring on Vancomycin - Cr remains < 1.0.   Injection drug use w/in 93mo- HIV Ab nonreactive. Will check Hep B sAg and Hep C Ab in AM for routine health screening.     PLAN: 1. Repeat blood cultures today (ordered) 2. Continue vancomycin IV for treatment  3. Hold on PICC/central line for now 4. Spine imaging and TEE when stabilized to do so     Principal Problem:   MRSA bacteremia Active Problems:   Acute infective pericarditis   Septic pulmonary embolism ([facility]   Suspected COVID-19 virus infection   Severe sepsis (HCC)   Respiratory distress   Hemoptysis   Respiratory failure, acute ([facility] [place] [facility]  Chest tube in place   Injection of illicit drug within last 12 months   Heroin use disorder, severe, dependence ([facility]   . chlorhexidine gluconate (MEDLINE KIT)  15 mL Mouth Rinse BID  . Chlorhexidine Gluconate Cloth  6 each Topical Q0600  . clonazePAM  2 mg Per Tube Q12H  . colchicine  0.6 mg Oral BID  . docusate  200 mg Per Tube BID  . free water  200 mL Per Tube Q6H  . heparin injection (subcutaneous)  5,000 Units Subcutaneous Q8H  . hydrocortisone sod succinate (SOLU-CORTEF) inj  50 mg Intravenous Q6H  . insulin aspart  0-20 Units Subcutaneous Q4H  . ipratropium-albuterol  3 mL Nebulization QID  . mouth rinse  15 mL Mouth Rinse 10 times per day  . mupirocin ointment  1 application Nasal BID  . oxyCODONE  10 mg Per Tube Q6H  . pantoprazole sodium  40 mg Per Tube Daily  . QUEtiapine  50 mg Per Tube Q12H  . sennosides  5 mL Per Tube BID    SUBJECTIVE: Remains intubated.   Interval Additions -  CT on the R over w/e for PTX. High vent settings (100% FiO2, 8 PEEP) and still with sats hovering 87%. Temp 100.8 F O/N. WBC trending down.  Not following commands and heavily sedated.  Repeat BCx on 10/23 growing MRSA again.  Resp Cx growing MRSA (R-bactrim, cipro)  Review of Systems: Review of Systems  Unable to perform ROS: Intubated    No Known Allergies  OBJECTIVE: Vitals:   09/03/19 0900 09/03/19 0915 09/03/19  0930 09/03/19 0945  BP: (!) 89/58 102/60 104/65 105/77  Pulse: (!) 122 (!) 123 (!) 128 (!) 128  Resp: (!) 38 (!) 39 (!) 39 (!) 43  Temp:      TempSrc:      SpO2: 92% 90% (!) 89% (!) 84%  Weight:      Height:       Body mass index is 31.17 kg/m.  Physical Exam Vitals signs and nursing note reviewed.  Constitutional:      Comments: Sedated on ventilator. Breathing over settings.   Cardiovascular:     Rate and Rhythm: Tachycardia present.     Heart sounds: Heart sounds are distant.     Comments: Ongoing ST elevation on tele Pulmonary:     Effort:  Tachypnea and accessory muscle usage present.     Breath sounds: No decreased breath sounds or wheezing. Rhonchi: coarse upper anterior breath sounds.  Abdominal:     General: Bowel sounds are normal.  Skin:    General: Skin is warm and dry.     Capillary Refill: Capillary refill takes less than 2 seconds.     Lab Results Lab Results  Component Value Date   WBC 18.0 (H) 09/03/2019   HGB 14.3 09/03/2019   HCT 42.0 09/03/2019   MCV 93.7 09/03/2019   PLT 198 09/03/2019    Lab Results  Component Value Date   CREATININE 0.99 09/03/2019   BUN 50 (H) 09/03/2019   NA 149 (H) 09/03/2019   K 5.3 (H) 09/03/2019   CL 119 (H) 09/03/2019   CO2 20 (L) 09/03/2019    Lab Results  Component Value Date   ALT 39 09/03/2019   AST 58 (H) 09/03/2019   ALKPHOS 123 09/03/2019   BILITOT 3.0 (H) 09/03/2019     Microbiology: Recent Results (from the past 240 hour(s))  Culture, blood (Routine x 2)     Status: Abnormal   Collection Time: 08/12/2019  6:30 PM   Specimen: BLOOD  Result Value Ref Range Status   Specimen Description BLOOD RIGHT ANTECUBITAL  Final   Special Requests   Final    BOTTLES DRAWN AEROBIC AND ANAEROBIC Blood Culture adequate volume   Culture  Setup Time   Final    IN BOTH AEROBIC AND ANAEROBIC BOTTLES GRAM POSITIVE COCCI CRITICAL RESULT CALLED TO, READ BACK BY AND VERIFIED WITH: G ABBOTT PHARMD 08/30/19 0717 JDW    Culture (A)  Final    STAPHYLOCOCCUS AUREUS SUSCEPTIBILITIES PERFORMED ON PREVIOUS CULTURE WITHIN THE LAST 5 DAYS. Performed at [facility], [place] [address] [place], [place] [address]    Report Status 09/01/2019 FINAL  Final  Culture, blood (Routine x 2)     Status: Abnormal   Collection Time: 08/28/2019  6:56 PM   Specimen: BLOOD RIGHT HAND  Result Value Ref Range Status   Specimen Description BLOOD RIGHT HAND  Final   Special Requests   Final    BOTTLES DRAWN AEROBIC ONLY Blood Culture results may not be optimal due to an inadequate volume of blood  received in culture bottles Performed at [facility] [address] [place],  [address]    Culture  Setup Time   Final    GRAM POSITIVE COCCI IN CLUSTERS AEROBIC BOTTLE ONLY CRITICAL RESULT CALLED TO, READ BACK BY AND VERIFIED WITH: Burman Foster, AT [phone] 08/30/19    Culture METHICILLIN RESISTANT STAPHYLOCOCCUS AUREUS (A)  Final   Report Status 09/01/2019 FINAL  Final  Organism ID, Bacteria METHICILLIN RESISTANT STAPHYLOCOCCUS AUREUS  Final      Susceptibility   Methicillin resistant staphylococcus aureus - MIC*    CIPROFLOXACIN >=8 RESISTANT Resistant     ERYTHROMYCIN >=8 RESISTANT Resistant     GENTAMICIN <=0.5 SENSITIVE Sensitive     OXACILLIN >=4 RESISTANT Resistant     TETRACYCLINE <=1 SENSITIVE Sensitive     VANCOMYCIN <=0.5 SENSITIVE Sensitive     TRIMETH/SULFA >=320 RESISTANT Resistant     CLINDAMYCIN <=0.25 SENSITIVE Sensitive     RIFAMPIN <=0.5 SENSITIVE Sensitive     Inducible Clindamycin NEGATIVE Sensitive     * METHICILLIN RESISTANT STAPHYLOCOCCUS AUREUS  Blood Culture ID Panel (Reflexed)     Status: Abnormal   Collection Time: 08/26/2019  6:56 PM  Result Value Ref Range Status   Enterococcus species NOT DETECTED NOT DETECTED Final   Listeria monocytogenes NOT DETECTED NOT DETECTED Final   Staphylococcus species DETECTED (A) NOT DETECTED Final    Comment: CRITICAL RESULT CALLED TO, READ BACK BY AND VERIFIED WITH: Leonie Green PharmD 10:30 08/29/29 ([email or web address])    Staphylococcus aureus (BCID) DETECTED (A) NOT DETECTED Final    Comment: Methicillin (oxacillin)-resistant Staphylococcus aureus (MRSA). MRSA is predictably resistant to beta-lactam antibiotics (except ceftaroline). Preferred therapy is vancomycin unless clinically contraindicated. Patient requires contact precautions if  hospitalized. CRITICAL RESULT CALLED TO, READ BACK BY AND VERIFIED WITH: Leonie Green PharmD 10:30 08/30/19 ([email or web address])    Methicillin resistance DETECTED (A) NOT DETECTED Final     Comment: CRITICAL RESULT CALLED TO, READ BACK BY AND VERIFIED WITH: Leonie Green PharmD 10:30 08/30/19 ([email or web address])    Streptococcus species NOT DETECTED NOT DETECTED Final   Streptococcus agalactiae NOT DETECTED NOT DETECTED Final   Streptococcus pneumoniae NOT DETECTED NOT DETECTED Final   Streptococcus pyogenes NOT DETECTED NOT DETECTED Final   Acinetobacter baumannii NOT DETECTED NOT DETECTED Final   Enterobacteriaceae species NOT DETECTED NOT DETECTED Final   Enterobacter cloacae complex NOT DETECTED NOT DETECTED Final   Escherichia coli NOT DETECTED NOT DETECTED Final   Klebsiella oxytoca NOT DETECTED NOT DETECTED Final   Klebsiella pneumoniae NOT DETECTED NOT DETECTED Final   Proteus species NOT DETECTED NOT DETECTED Final   Serratia marcescens NOT DETECTED NOT DETECTED Final   Haemophilus influenzae NOT DETECTED NOT DETECTED Final   Neisseria meningitidis NOT DETECTED NOT DETECTED Final   Pseudomonas aeruginosa NOT DETECTED NOT DETECTED Final   Candida albicans NOT DETECTED NOT DETECTED Final   Candida glabrata NOT DETECTED NOT DETECTED Final   Candida krusei NOT DETECTED NOT DETECTED Final   Candida parapsilosis NOT DETECTED NOT DETECTED Final   Candida tropicalis NOT DETECTED NOT DETECTED Final    Comment: Performed at [facility], [place]. [address] [place], [place] [address]  SARS CORONAVIRUS 2 (TAT 6-24 HRS) Nasopharyngeal Nasopharyngeal Swab     Status: None   Collection Time: 09/07/2019 10:11 PM   Specimen: Nasopharyngeal Swab  Result Value Ref Range Status   SARS Coronavirus 2 NEGATIVE NEGATIVE Final    Comment: (NOTE) SARS-CoV-2 target nucleic acids are NOT DETECTED. The SARS-CoV-2 RNA is generally detectable in upper and lower respiratory specimens during the acute phase of infection. Negative results do not preclude SARS-CoV-2 infection, do not rule out co-infections with other pathogens, and should not be used as the sole basis for treatment or other patient  management decisions. Negative results must be combined with clinical observations, patient history, and epidemiological information. The expected result is Negative. Fact Sheet for Patients: SugarRoll.be  Fact Sheet for Healthcare Providers: https://www.woods-mathews.com/ This test is not yet approved or cleared by the [place] FDA and  has been authorized for detection and/or diagnosis of SARS-CoV-2 by FDA under an Emergency Use Authorization (EUA). This EUA will remain  in effect (meaning this test can be used) for the duration of the COVID-19 declaration under Section 56 4(b)(1) of the Act, 21 U.S.C. section 360bbb-3(b)(1), unless the authorization is terminated or revoked sooner. Performed at [facility], [place] [address], [place], [place] [address]   Urine culture     Status: Abnormal   Collection Time: 09/07/2019 10:15 PM   Specimen: In/Out Cath Urine  Result Value Ref Range Status   Specimen Description IN/OUT CATH URINE  Final   Special Requests   Final    NONE Performed at [facility], [place] [address], [place], [place] [address]    Culture (A)  Final    3,000 COLONIES/mL METHICILLIN RESISTANT STAPHYLOCOCCUS AUREUS   Report Status 09/01/2019 FINAL  Final   Organism ID, Bacteria METHICILLIN RESISTANT STAPHYLOCOCCUS AUREUS (A)  Final      Susceptibility   Methicillin resistant staphylococcus aureus - MIC*    CIPROFLOXACIN >=8 RESISTANT Resistant     GENTAMICIN <=0.5 SENSITIVE Sensitive     NITROFURANTOIN <=16 SENSITIVE Sensitive     OXACILLIN >=4 RESISTANT Resistant     TETRACYCLINE <=1 SENSITIVE Sensitive     VANCOMYCIN 1 SENSITIVE Sensitive     TRIMETH/SULFA >=320 RESISTANT Resistant     CLINDAMYCIN <=0.25 SENSITIVE Sensitive     RIFAMPIN <=0.5 SENSITIVE Sensitive     Inducible Clindamycin NEGATIVE Sensitive     * 3,000 COLONIES/mL METHICILLIN RESISTANT STAPHYLOCOCCUS AUREUS  SARS CORONAVIRUS 2 (TAT 6-24 HRS)  Nasopharyngeal Nasopharyngeal Swab     Status: None   Collection Time: 08/30/19  5:38 AM   Specimen: Nasopharyngeal Swab  Result Value Ref Range Status   SARS Coronavirus 2 NEGATIVE NEGATIVE Final    Comment: (NOTE) SARS-CoV-2 target nucleic acids are NOT DETECTED. The SARS-CoV-2 RNA is generally detectable in upper and lower respiratory specimens during the acute phase of infection. Negative results do not preclude SARS-CoV-2 infection, do not rule out co-infections with other pathogens, and should not be used as the sole basis for treatment or other patient management decisions. Negative results must be combined with clinical observations, patient history, and epidemiological information. The expected result is Negative. Fact Sheet for Patients: SugarRoll.be Fact Sheet for Healthcare Providers: https://www.woods-mathews.com/ This test is not yet approved or cleared by the [place] FDA and  has been authorized for detection and/or diagnosis of SARS-CoV-2 by FDA under an Emergency Use Authorization (EUA). This EUA will remain  in effect (meaning this test can be used) for the duration of the COVID-19 declaration under Section 56 4(b)(1) of the Act, 21 U.S.C. section 360bbb-3(b)(1), unless the authorization is terminated or revoked sooner. Performed at [facility], [place] [address] [place], [place] [address]   Culture, respiratory     Status: None   Collection Time: 08/30/19  5:39 AM   Specimen: SPU  Result Value Ref Range Status   Specimen Description SPUTUM  Final   Special Requests NONE  Final   Gram Stain   Final    NO WBC SEEN NO ORGANISMS SEEN Performed at [facility], [place] [address], [place], [place] [address]    Culture FEW METHICILLIN RESISTANT STAPHYLOCOCCUS AUREUS  Final   Report Status 09/01/2019 FINAL  Final  Organism ID, Bacteria METHICILLIN RESISTANT STAPHYLOCOCCUS AUREUS  Final      Susceptibility    Methicillin resistant staphylococcus aureus - MIC*    CIPROFLOXACIN >=8 RESISTANT Resistant     ERYTHROMYCIN >=8 RESISTANT Resistant     GENTAMICIN <=0.5 SENSITIVE Sensitive     OXACILLIN >=4 RESISTANT Resistant     TETRACYCLINE <=1 SENSITIVE Sensitive     VANCOMYCIN 1 SENSITIVE Sensitive     TRIMETH/SULFA >=320 RESISTANT Resistant     CLINDAMYCIN <=0.25 SENSITIVE Sensitive     RIFAMPIN <=0.5 SENSITIVE Sensitive     Inducible Clindamycin NEGATIVE Sensitive     * FEW METHICILLIN RESISTANT STAPHYLOCOCCUS AUREUS  Respiratory Panel by PCR     Status: Abnormal   Collection Time: 08/30/19  7:27 AM   Specimen: Nasopharyngeal Swab; Respiratory  Result Value Ref Range Status   Adenovirus NOT DETECTED NOT DETECTED Final   Coronavirus 229E NOT DETECTED NOT DETECTED Final    Comment: (NOTE) The Coronavirus on the Respiratory Panel, DOES NOT test for the novel  Coronavirus (2019 nCoV)    Coronavirus HKU1 NOT DETECTED NOT DETECTED Final   Coronavirus NL63 NOT DETECTED NOT DETECTED Final   Coronavirus OC43 NOT DETECTED NOT DETECTED Final   Metapneumovirus NOT DETECTED NOT DETECTED Final   Rhinovirus / Enterovirus DETECTED (A) NOT DETECTED Final   Influenza A NOT DETECTED NOT DETECTED Final   Influenza B NOT DETECTED NOT DETECTED Final   Parainfluenza Virus 1 NOT DETECTED NOT DETECTED Final   Parainfluenza Virus 2 NOT DETECTED NOT DETECTED Final   Parainfluenza Virus 3 NOT DETECTED NOT DETECTED Final   Parainfluenza Virus 4 NOT DETECTED NOT DETECTED Final   Respiratory Syncytial Virus NOT DETECTED NOT DETECTED Final   Bordetella pertussis NOT DETECTED NOT DETECTED Final   Chlamydophila pneumoniae NOT DETECTED NOT DETECTED Final   Mycoplasma pneumoniae NOT DETECTED NOT DETECTED Final    Comment: Performed at [facility], [place] [address] [place], [place] [address]  MRSA PCR Screening     Status: None   Collection Time: 08/30/19  3:02 PM   Specimen: Nasopharyngeal  Result Value Ref Range  Status   MRSA by PCR NEGATIVE NEGATIVE Final    Comment:        The GeneXpert MRSA Assay (FDA approved for NASAL specimens only), is one component of a comprehensive MRSA colonization surveillance program. It is not intended to diagnose MRSA infection nor to guide or monitor treatment for MRSA infections. Performed at [facility], [place] [address], [place], [place] [address]   Culture, blood (routine x 2)     Status: Abnormal (Preliminary result)   Collection Time: 08/31/19  5:35 AM   Specimen: BLOOD RIGHT HAND  Result Value Ref Range Status   Specimen Description BLOOD RIGHT HAND  Final   Special Requests   Final    BOTTLES DRAWN AEROBIC ONLY Blood Culture results may not be optimal due to an inadequate volume of blood received in culture bottles   Culture  Setup Time   Final    GRAM POSITIVE COCCI AEROBIC BOTTLE ONLY CRITICAL VALUE NOTED.  VALUE IS CONSISTENT WITH PREVIOUSLY REPORTED AND CALLED VALUE.    Culture (A)  Final    STAPHYLOCOCCUS AUREUS SUSCEPTIBILITIES PERFORMED ON PREVIOUS CULTURE WITHIN THE LAST 5 DAYS. Performed at [facility], [place] [address] [place],  [address]    Report Status PENDING  Incomplete  Culture, blood (routine x 2)     Status: Abnormal  Collection Time: 08/31/19  5:40 AM   Specimen: BLOOD LEFT HAND  Result Value Ref Range Status   Specimen Description BLOOD LEFT HAND  Final   Special Requests   Final    BOTTLES DRAWN AEROBIC ONLY Blood Culture adequate volume   Culture  Setup Time   Final    GRAM POSITIVE COCCI AEROBIC BOTTLE ONLY CRITICAL VALUE NOTED.  VALUE IS CONSISTENT WITH PREVIOUSLY REPORTED AND CALLED VALUE.    Culture (A)  Final    STAPHYLOCOCCUS AUREUS SUSCEPTIBILITIES PERFORMED ON PREVIOUS CULTURE WITHIN THE LAST 5 DAYS. Performed at [facility], [place] [address] [place], [place] [address]    Report Status 09/01/2019 FINAL  Final     Janene Madeira, MSN, NP-C [place] for Infectious  Disease [email or web address] Pager: [phone] Office: [phone] [place]: [phone]

## 2019-09-03 NOTE — Procedures (Signed)
Chest Tube Insertion Procedure Note  Indications:  Clinically significant Pneumothorax  Pre-operative Diagnosis: Pneumothorax  Post-operative Diagnosis: Pneumothorax  Procedure Details  Informed consent was obtained for the procedure, including sedation.  Risks of lung perforation, hemorrhage, arrhythmia, and adverse drug reaction were discussed.   After sterile skin prep, using standard technique, a 14 French tube was placed in the left lateral 4 rib space.  Findings: None  Estimated Blood Loss:  Minimal         Specimens:  None              Complications:  None; patient tolerated the procedure well.         Disposition: ICU - intubated and critically ill.         Condition: stable  Attending Attestation: I performed the procedure.

## 2019-09-04 ENCOUNTER — Inpatient Hospital Stay (HOSPITAL_COMMUNITY): Payer: Medicaid Other

## 2019-09-04 DIAGNOSIS — J9383 Other pneumothorax: Secondary | ICD-10-CM

## 2019-09-04 DIAGNOSIS — I480 Paroxysmal atrial fibrillation: Secondary | ICD-10-CM

## 2019-09-04 DIAGNOSIS — B9562 Methicillin resistant Staphylococcus aureus infection as the cause of diseases classified elsewhere: Secondary | ICD-10-CM

## 2019-09-04 DIAGNOSIS — L8990 Pressure ulcer of unspecified site, unspecified stage: Secondary | ICD-10-CM | POA: Insufficient documentation

## 2019-09-04 DIAGNOSIS — R7881 Bacteremia: Secondary | ICD-10-CM

## 2019-09-04 DIAGNOSIS — E872 Acidosis: Secondary | ICD-10-CM

## 2019-09-04 DIAGNOSIS — J939 Pneumothorax, unspecified: Secondary | ICD-10-CM

## 2019-09-04 LAB — POCT I-STAT 7, (LYTES, BLD GAS, ICA,H+H)
Acid-base deficit: 5 mmol/L — ABNORMAL HIGH (ref 0.0–2.0)
Acid-base deficit: 8 mmol/L — ABNORMAL HIGH (ref 0.0–2.0)
Acid-base deficit: 6 mmol/L — ABNORMAL HIGH (ref 0.0–2.0)
Bicarbonate: 21.6 mmol/L (ref 20.0–28.0)
Bicarbonate: 24 mmol/L (ref 20.0–28.0)
Bicarbonate: 26.5 mmol/L (ref 20.0–28.0)
Calcium, Ion: 1.2 mmol/L (ref 1.15–1.40)
Calcium, Ion: 1.44 mmol/L — ABNORMAL HIGH (ref 1.15–1.40)
Calcium, Ion: 1.18 mmol/L (ref 1.15–1.40)
HCT: 32 % — ABNORMAL LOW (ref 39.0–52.0)
HCT: 32 % — ABNORMAL LOW (ref 39.0–52.0)
HCT: 37 % — ABNORMAL LOW (ref 39.0–52.0)
Hemoglobin: 10.9 g/dL — ABNORMAL LOW (ref 13.0–17.0)
Hemoglobin: 10.9 g/dL — ABNORMAL LOW (ref 13.0–17.0)
Hemoglobin: 12.6 g/dL — ABNORMAL LOW (ref 13.0–17.0)
O2 Saturation: 94 %
O2 Saturation: 90 %
O2 Saturation: 67 %
Patient temperature: 98.5
Patient temperature: 100.6
Potassium: 5.9 mmol/L — ABNORMAL HIGH (ref 3.5–5.1)
Potassium: 6.4 mmol/L (ref 3.5–5.1)
Potassium: 6.8 mmol/L (ref 3.5–5.1)
Sodium: 148 mmol/L — ABNORMAL HIGH (ref 135–145)
Sodium: 149 mmol/L — ABNORMAL HIGH (ref 135–145)
Sodium: 151 mmol/L — ABNORMAL HIGH (ref 135–145)
TCO2: 23 mmol/L (ref 22–32)
TCO2: 27 mmol/L (ref 22–32)
TCO2: 29 mmol/L (ref 22–32)
pCO2 arterial: 43 mmHg (ref 32.0–48.0)
pCO2 arterial: 90.5 mmHg (ref 32.0–48.0)
pCO2 arterial: 96 mmHg (ref 32.0–48.0)
pH, Arterial: 7.309 — ABNORMAL LOW (ref 7.350–7.450)
pH, Arterial: 7.032 — CL (ref 7.350–7.450)
pH, Arterial: 7.055 — CL (ref 7.350–7.450)
pO2, Arterial: 79 mmHg — ABNORMAL LOW (ref 83.0–108.0)
pO2, Arterial: 87 mmHg (ref 83.0–108.0)
pO2, Arterial: 55 mmHg — ABNORMAL LOW (ref 83.0–108.0)

## 2019-09-04 LAB — COMPREHENSIVE METABOLIC PANEL
ALT: 36 U/L (ref 0–44)
ALT: 34 U/L (ref 0–44)
AST: 61 U/L — ABNORMAL HIGH (ref 15–41)
AST: 52 U/L — ABNORMAL HIGH (ref 15–41)
Albumin: 1.3 g/dL — ABNORMAL LOW (ref 3.5–5.0)
Albumin: 1.1 g/dL — ABNORMAL LOW (ref 3.5–5.0)
Alkaline Phosphatase: 124 U/L (ref 38–126)
Alkaline Phosphatase: 133 U/L — ABNORMAL HIGH (ref 38–126)
Anion gap: 7 (ref 5–15)
Anion gap: 9 (ref 5–15)
BUN: 94 mg/dL — ABNORMAL HIGH (ref 6–20)
BUN: 110 mg/dL — ABNORMAL HIGH (ref 6–20)
CO2: 20 mmol/L — ABNORMAL LOW (ref 22–32)
CO2: 22 mmol/L (ref 22–32)
Calcium: 8.1 mg/dL — ABNORMAL LOW (ref 8.9–10.3)
Calcium: 9 mg/dL (ref 8.9–10.3)
Chloride: 120 mmol/L — ABNORMAL HIGH (ref 98–111)
Chloride: 118 mmol/L — ABNORMAL HIGH (ref 98–111)
Creatinine, Ser: 1.7 mg/dL — ABNORMAL HIGH (ref 0.61–1.24)
Creatinine, Ser: 2.24 mg/dL — ABNORMAL HIGH (ref 0.61–1.24)
GFR calc Af Amer: 54 mL/min — ABNORMAL LOW (ref >60)
GFR calc Af Amer: 39 mL/min — ABNORMAL LOW (ref >60)
GFR calc non Af Amer: 47 mL/min — ABNORMAL LOW (ref >60)
GFR calc non Af Amer: 33 mL/min — ABNORMAL LOW (ref >60)
Glucose, Bld: 208 mg/dL — ABNORMAL HIGH (ref 70–99)
Glucose, Bld: 313 mg/dL — ABNORMAL HIGH (ref 70–99)
Potassium: 6 mmol/L — ABNORMAL HIGH (ref 3.5–5.1)
Potassium: 6.5 mmol/L (ref 3.5–5.1)
Sodium: 147 mmol/L — ABNORMAL HIGH (ref 135–145)
Sodium: 149 mmol/L — ABNORMAL HIGH (ref 135–145)
Total Bilirubin: 3.3 mg/dL — ABNORMAL HIGH (ref 0.3–1.2)
Total Bilirubin: 2 mg/dL — ABNORMAL HIGH (ref 0.3–1.2)
Total Protein: 6.4 g/dL — ABNORMAL LOW (ref 6.5–8.1)
Total Protein: 5.6 g/dL — ABNORMAL LOW (ref 6.5–8.1)

## 2019-09-04 LAB — MAGNESIUM
Magnesium: 2.5 mg/dL — ABNORMAL HIGH (ref 1.7–2.4)
Magnesium: 2.5 mg/dL — ABNORMAL HIGH (ref 1.7–2.4)

## 2019-09-04 LAB — CBC
HCT: 37.1 % — ABNORMAL LOW (ref 39.0–52.0)
Hemoglobin: 11.7 g/dL — ABNORMAL LOW (ref 13.0–17.0)
MCH: 30.7 pg (ref 26.0–34.0)
MCHC: 31.5 g/dL (ref 30.0–36.0)
MCV: 97.4 fL (ref 80.0–100.0)
Platelets: 213 10*3/uL (ref 150–400)
RBC: 3.81 MIL/uL — ABNORMAL LOW (ref 4.22–5.81)
RDW: 15.7 % — ABNORMAL HIGH (ref 11.5–15.5)
WBC: 23 10*3/uL — ABNORMAL HIGH (ref 4.0–10.5)
nRBC: 0 % (ref 0.0–0.2)

## 2019-09-04 LAB — CREATININE, URINE, RANDOM: Creatinine, Urine: 60.23 mg/dL

## 2019-09-04 LAB — BLOOD GAS, ARTERIAL
Acid-base deficit: 7 mmol/L — ABNORMAL HIGH (ref 0.0–2.0)
Bicarbonate: 23.7 mmol/L (ref 20.0–28.0)
Drawn by: 511911
FIO2: 100
MECHVT: 540 mL
O2 Saturation: 57.3 %
PEEP: 8 cmH2O
Patient temperature: 98.4
RATE: 28 resp/min
pCO2 arterial: 108 mmHg (ref 32.0–48.0)
pH, Arterial: 6.971 — CL (ref 7.350–7.450)
pO2, Arterial: 39.6 mmHg — CL (ref 83.0–108.0)

## 2019-09-04 LAB — GLUCOSE, CAPILLARY
Glucose-Capillary: 181 mg/dL — ABNORMAL HIGH (ref 70–99)
Glucose-Capillary: 205 mg/dL — ABNORMAL HIGH (ref 70–99)
Glucose-Capillary: 232 mg/dL — ABNORMAL HIGH (ref 70–99)
Glucose-Capillary: 207 mg/dL — ABNORMAL HIGH (ref 70–99)
Glucose-Capillary: 176 mg/dL — ABNORMAL HIGH (ref 70–99)

## 2019-09-04 LAB — HEPATITIS C ANTIBODY: HCV Ab: REACTIVE — AB

## 2019-09-04 LAB — LACTIC ACID, PLASMA
Lactic Acid, Venous: 2.2 mmol/L (ref 0.5–1.9)
Lactic Acid, Venous: 4.7 mmol/L (ref 0.5–1.9)

## 2019-09-04 LAB — PHOSPHORUS: Phosphorus: 9.6 mg/dL — ABNORMAL HIGH (ref 2.5–4.6)

## 2019-09-04 LAB — TRIGLYCERIDES: Triglycerides: 164 mg/dL — ABNORMAL HIGH (ref <150)

## 2019-09-04 LAB — PROCALCITONIN: Procalcitonin: 8.74 ng/mL

## 2019-09-04 LAB — SODIUM, URINE, RANDOM: Sodium, Ur: <10 mmol/L

## 2019-09-04 LAB — HEPATITIS B SURFACE ANTIGEN: Hepatitis B Surface Ag: NONREACTIVE

## 2019-09-04 MED ORDER — NOREPINEPHRINE 16 MG/250ML-% IV SOLN
0.0000 ug/min | INTRAVENOUS | Status: DC
  Administered 2019-09-04: 40 ug/min via INTRAVENOUS
  Administered 2019-09-04: 60 ug/min via INTRAVENOUS
  Filled 2019-09-04 (×2): qty 250

## 2019-09-04 MED ORDER — DEXTROSE 50 % IV SOLN
50.0000 mL | Freq: Once | INTRAVENOUS | Status: AC
  Administered 2019-09-04: 50 mL via INTRAVENOUS
  Filled 2019-09-04: qty 50

## 2019-09-04 MED ORDER — PRISMASOL BGK 0/2.5 32-2.5 MEQ/L REPLACEMENT SOLN
Status: DC
  Administered 2019-09-04: 20:00:00 via INTRAVENOUS_CENTRAL
  Filled 2019-09-04 (×2): qty 5000

## 2019-09-04 MED ORDER — INSULIN ASPART 100 UNIT/ML IV SOLN
10.0000 [IU] | Freq: Once | INTRAVENOUS | Status: AC
  Administered 2019-09-04: 10 [IU] via INTRAVENOUS

## 2019-09-04 MED ORDER — SODIUM BICARBONATE 8.4 % IV SOLN
INTRAVENOUS | Status: AC
  Administered 2019-09-04: 50 meq via INTRAVENOUS
  Filled 2019-09-04: qty 50

## 2019-09-04 MED ORDER — CHLORHEXIDINE GLUCONATE CLOTH 2 % EX PADS: 6.0000 | MEDICATED_PAD | Freq: Every day | CUTANEOUS | Status: DC

## 2019-09-04 MED ORDER — CALCIUM GLUCONATE-NACL 1-0.675 GM/50ML-% IV SOLN
1.0000 g | Freq: Once | INTRAVENOUS | Status: AC
  Administered 2019-09-04: 17:00:00 1000 mg via INTRAVENOUS
  Filled 2019-09-04: qty 50

## 2019-09-04 MED ORDER — PHENYLEPHRINE HCL-NACL 40-0.9 MG/250ML-% IV SOLN
0.0000 ug/min | INTRAVENOUS | Status: DC
  Administered 2019-09-04 (×2): 400 ug/min via INTRAVENOUS
  Administered 2019-09-04: 350 ug/min via INTRAVENOUS
  Administered 2019-09-04: 400 ug/min via INTRAVENOUS
  Filled 2019-09-04 (×7): qty 250

## 2019-09-04 MED ORDER — NOREPINEPHRINE 4 MG/250ML-% IV SOLN
INTRAVENOUS | Status: AC
  Administered 2019-09-04: 4 mg
  Filled 2019-09-04: qty 250

## 2019-09-04 MED ORDER — HEPARIN SODIUM (PORCINE) 1000 UNIT/ML DIALYSIS
1000.0000 [IU] | INTRAMUSCULAR | Status: DC | PRN
  Administered 2019-09-04: 3000 [IU] via INTRAVENOUS_CENTRAL
  Filled 2019-09-04 (×2): qty 6
  Filled 2019-09-04: qty 3

## 2019-09-04 MED ORDER — AMIODARONE HCL IN DEXTROSE 360-4.14 MG/200ML-% IV SOLN
30.0000 mg/h | INTRAVENOUS | Status: DC
  Administered 2019-09-04: 30 mg/h via INTRAVENOUS
  Filled 2019-09-04: qty 200

## 2019-09-04 MED ORDER — VASOPRESSIN 20 UNIT/ML IV SOLN
0.0300 [IU]/min | INTRAVENOUS | Status: DC
  Administered 2019-09-04: 0.03 [IU]/min via INTRAVENOUS
  Filled 2019-09-04: qty 2

## 2019-09-04 MED ORDER — SODIUM CHLORIDE 0.9 % IV SOLN
INTRAVENOUS | Status: DC
  Administered 2019-09-04: 12:00:00 via INTRAVENOUS

## 2019-09-04 MED ORDER — SODIUM CHLORIDE 0.9 % IV SOLN
0.0000 ug/min | INTRAVENOUS | Status: DC
  Filled 2019-09-04: qty 8

## 2019-09-04 MED ORDER — SODIUM BICARBONATE 8.4 % IV SOLN
INTRAVENOUS | Status: AC
  Filled 2019-09-04: qty 50

## 2019-09-04 MED ORDER — SODIUM POLYSTYRENE SULFONATE 15 GM/60ML PO SUSP
30.0000 g | Freq: Once | ORAL | Status: AC
  Administered 2019-09-04: 30 g
  Filled 2019-09-04: qty 120

## 2019-09-04 MED ORDER — PRISMASOL BGK 0/2.5 32-2.5 MEQ/L IV SOLN
INTRAVENOUS | Status: DC
  Administered 2019-09-04 (×2): via INTRAVENOUS_CENTRAL
  Filled 2019-09-04 (×11): qty 5000

## 2019-09-04 MED ORDER — PHENYLEPHRINE HCL-NACL 10-0.9 MG/250ML-% IV SOLN
0.0000 ug/min | INTRAVENOUS | Status: DC
  Administered 2019-09-04: 300 ug/min via INTRAVENOUS
  Administered 2019-09-04: 10 ug/min via INTRAVENOUS
  Filled 2019-09-04: qty 250
  Filled 2019-09-04: qty 500
  Filled 2019-09-04: qty 250

## 2019-09-04 MED ORDER — VANCOMYCIN HCL 10 G IV SOLR
1250.0000 mg | INTRAVENOUS | Status: DC
  Administered 2019-09-04: 1250 mg via INTRAVENOUS
  Filled 2019-09-04 (×2): qty 1250

## 2019-09-04 MED ORDER — AMIODARONE LOAD VIA INFUSION
150.0000 mg | Freq: Once | INTRAVENOUS | Status: AC
  Administered 2019-09-04: 150 mg via INTRAVENOUS
  Filled 2019-09-04: qty 83.34

## 2019-09-04 MED ORDER — SODIUM BICARBONATE 8.4 % IV SOLN
50.0000 meq | Freq: Once | INTRAVENOUS | Status: AC
  Administered 2019-09-04: 17:00:00 50 meq via INTRAVENOUS

## 2019-09-04 MED ORDER — SODIUM ZIRCONIUM CYCLOSILICATE 10 G PO PACK
10.0000 g | PACK | ORAL | Status: AC
  Administered 2019-09-04: 10 g via ORAL
  Filled 2019-09-04: qty 1

## 2019-09-04 MED ORDER — AMIODARONE HCL IN DEXTROSE 360-4.14 MG/200ML-% IV SOLN
60.0000 mg/h | INTRAVENOUS | Status: AC
  Administered 2019-09-04 (×2): 60 mg/h via INTRAVENOUS
  Filled 2019-09-04 (×2): qty 200

## 2019-09-04 MED ORDER — SODIUM BICARBONATE 8.4 % IV SOLN
50.0000 meq | Freq: Once | INTRAVENOUS | Status: AC
  Administered 2019-09-04: 50 meq via INTRAVENOUS

## 2019-09-04 MED ORDER — VECURONIUM BROMIDE 10 MG IV SOLR
INTRAVENOUS | Status: AC
  Administered 2019-09-04: 10 mg via NASAL
  Filled 2019-09-04: qty 20

## 2019-09-05 ENCOUNTER — Inpatient Hospital Stay (HOSPITAL_COMMUNITY): Payer: Medicaid Other

## 2019-09-05 LAB — CULTURE, BLOOD (ROUTINE X 2)
Special Requests: ADEQUATE
Special Requests: ADEQUATE

## 2019-09-05 MED FILL — Medication: Qty: 2 | Status: AC

## 2019-09-05 NOTE — Progress Notes (Signed)
Called to respond to cardiac arrest.  Given that patient had been on three pressors, refractory acidosis, refractory respiratory failure, renal failure no more interventions that will change the course.   Given prognosis and futility of cpr, terminated cardiac resuscitation after discussing with mother at bedside.  Mother of patient and sister in law at bedside, informed of his death.

## 2019-09-05 NOTE — Progress Notes (Signed)
Patient became increasingly hemodynamically unstable the afternoon of 08/11/2019 requiring multiple vasopressors, additional chest tubes, and access lines (HD cath and CVC, as well as an arterial line); despite said interventions patient continued to deteriorate through my shift. CRRT was initiated at 20:32 given family wanting to continue aggressive treatment. At approximately 23:40 patient became pulseless requiring code blue activation, CPR, and ACLS medications. CCMD at bedside during code and stopped resuscitation efforts at 23:45 due to patient's previous continued instability throughout the day; family also at bedside during that time. [place] donor services referral called at 00:36. No belongings with patient. 140cc Fentanyl gtt and 10cc Versed gtt wasted and witnessed by Weldon Inches, RN. Waiting on additional family. Body to be prepared and transferred to the morgue there after.

## 2019-09-05 NOTE — Progress Notes (Signed)
Chaplain responded to Code Blue alert.  Chaplain provided comfort and care to Scott Strong's family.  Chaplain escorted step-daughter to room from ED.  Family is currently awaiting Scott Strong's brother to arrive from [place] in the next hour to see him.  Family did not want prayer, but did want to surround him at bedside with each other.  Chaplain will follow-up as needed.

## 2019-09-08 LAB — CULTURE, BLOOD (ROUTINE X 2)

## 2019-09-09 NOTE — Progress Notes (Signed)
Pharmacy Antibiotic Note  Scott Strong is a 48 y.o. male admitted on 08/20/2019 with MRSA bacteremia and concern for endocarditis with pulmonary septic emboli on chest CT. Pharmacy consulted for vancomycin dosing.  Now with new pneumothorax s/p chest tube placement.  Increased her frequency to q12h yesterday with resolving AKI, Scr jumped back up from 0.99 to 1.7 today so will extend her interval back to q24 and continue to monitor    Plan: Vancomycin 1250 q24h for a predicted AUC of 482 Scr 1.7 Monitor renal fxn, clinical progress, vanc AUC this week F/U TEE, repeat BCx   Height: 5\' 8"  (172.7 cm) Weight: 207 lb 7.3 oz (94.1 kg) IBW/kg (Calculated) : 68.4  Temp (24hrs), Avg:98.6 F (37 C), Min:98.2 F (36.8 C), Max:99 F (37.2 C)  Recent Labs  Lab 08/30/19 0727 08/30/19 1000  08/30/19 1505 08/31/19 0527 08/31/19 1150 09/01/19 0306 09/02/19 0259 09/02/19 0501 09/03/19 0317 Sep 17, 2019 0313  WBC  --   --   --   --  22.4*  --  19.6*  --  22.1* 18.0* 23.0*  CREATININE  --   --    < >  --  1.60*  --  1.14 0.98  --  0.99 1.70*  LATICACIDVEN 2.8* 3.3*  --  3.7* 2.2* 2.0*  --   --   --   --   --    < > = values in this interval not displayed.    Estimated Creatinine Clearance: 59.2 mL/min (A) (by C-G formula based on SCr of 1.7 mg/dL (H)).    No Known Allergies   Vanc 10/22>> Cefepime 10/22>>10/22 CTX x 1 10/21 Azithro x 1 10/21  10/21 Blood >> 2/2 GPC in clusters, MRSA on BCID 10/21 UCx - MRSA 10/22 RVP - +rhinovirus/enterovirus 10/22 COVID >> neg 10/22 MRSA PCR >> neg 10/22 RCx >> MRSA 10/23 BCx >>MRSA  Nicoletta Dress, PharmD PGY2 Infectious Disease Pharmacy Resident  09/17/2019, 9:00 AM

## 2019-09-09 NOTE — Consult Note (Addendum)
[facility] Renal Consultation Note  Requesting MD: Audria Nine, DO  Indication for Consultation: hyperkalemia, AKI   Chief complaint: sepsis  HPI:  Scott Strong is a 48 y.o. 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  He has decompensated and has been intubated.  Had code blue called earlier today - v fib and was temporized with a chest tube.  Worsening renal function and course also complicated by hyperkalemia, resp acidosis.  Refractory thus far.  His mother is at the bedside and would like to pursue aggressive care.  Spoke with her via Optometrist.  She understands that he may not tolerate CRRT or line insertion but does want to try anything to help him.  His girlfriend whom he had designated as a Marine scientist would also like to pursue aggressive care.  He has been maxed on multiple pressors.  Has been tachycardic to the 160's.  Hypoxic despite vent.  Has been on amio with the tachycardia.   Creatinine, Ser  Date/Time Value Ref Range Status  08/21/2019 03:21 PM 2.24 (H) 0.61 - 1.24 mg/dL Final  09/06/2019 03:13 AM 1.70 (H) 0.61 - 1.24 mg/dL Final  09/03/2019 03:17 AM 0.99 0.61 - 1.24 mg/dL Final  09/02/2019 02:59 AM 0.98 0.61 - 1.24 mg/dL Final  09/01/2019 03:06 AM 1.14 0.61 - 1.24 mg/dL Final  08/31/2019 05:27 AM 1.60 (H) 0.61 - 1.24 mg/dL Final  08/30/2019 12:29 PM 1.59 (H) 0.61 - 1.24 mg/dL Final  08/30/2019 02:49 AM 1.85 (H) 0.61 - 1.24 mg/dL Final  2019/09/25 06:38 PM 2.70 (H) 0.61 - 1.24 mg/dL Final     PMHx: IV drug use    Family Hx: unable to obtain 2/2 intubation   Social History:  reports that he has never smoked. He has never used smokeless tobacco. He reports previous alcohol use. He reports current drug use. Drugs: IV and Heroin.  Allergies: No Known Allergies  Medications: Prior to Admission medications   Not on File    I have reviewed the patient's current medications.  Labs:  BMP Latest  Ref Rng & Units 08/09/2019 08/13/2019 08/27/2019  Glucose 70 - 99 mg/dL - 313(H) -  BUN 6 - 20 mg/dL - 110(H) -  Creatinine 0.61 - 1.24 mg/dL - 2.24(H) -  Sodium 135 - 145 mmol/L 151(H) 149(H) 149(H)  Potassium 3.5 - 5.1 mmol/L 6.8(HH) 6.5(HH) 6.4(HH)  Chloride 98 - 111 mmol/L - 118(H) -  CO2 22 - 32 mmol/L - 22 -  Calcium 8.9 - 10.3 mg/dL - 9.0 -    Urinalysis    Component Value Date/Time   COLORURINE AMBER (A) 09/25/2019 2214   APPEARANCEUR CLOUDY (A) September 25, 2019 2214   LABSPEC 1.019 09-25-2019 2214   PHURINE 5.0 09-25-2019 2214   GLUCOSEU NEGATIVE 2019/09/25 2214   HGBUR MODERATE (A) 2019-09-25 2214   BILIRUBINUR NEGATIVE 2019-09-25 [place] 09-25-19 2214   PROTEINUR 100 (A) 2019-09-25 2214   NITRITE NEGATIVE September 25, 2019 2214   LEUKOCYTESUR MODERATE (A) 25-Sep-2019 2214     ROS:  Unable to obtain secondary to intubation  Physical Exam: Vitals:   09/07/2019 1715 08/25/2019 1730  BP:    Pulse: (!) 163 (!) 169  Resp: (!) 22 (!) 28  Temp:    SpO2: (!) 77% (!) 75%     General: adult male critically ill intubated  HEENT: NCAT Eyes: eyes closed Heart: tachycardia  Lungs: wheezing and reduced breath sounds;bilateral chest tube in place  Abdomen:  soft; reduced bowel sounds  Extremities:  Trace lower extremity and 2+ upper ext edema  Skin: no rashes on extremities exposed  Neuro: sedation running  Assessment/Plan:  # Hyperkalemia - refractory  - Starting CRRT though concern that he may not tolerate; team has been having ongoing discussions with family re: his clinical decline.  If continued clinical worsening or does not tolerate CRRT would reassess goals of care   # AKI  - Worsening ATN   - Starting CRRT though concern that he may not tolerate as above  - team is placing a vascath  # Septic shock  - MRSA bacteremia  - Continue pressors per primary team   # Acute hypoxic respiratory failure on ventilation  - continue mech ventilation per  pulmonary   # Hypernatremia  - Follow with CRRT   # Resp acidosis  - Vent per pulm    Estanislado Emms 09-08-19, 6:06 PM

## 2019-09-09 NOTE — Progress Notes (Addendum)
Progress Note  Patient Name: Scott Strong Date of Encounter: 09-29-2019  Primary Cardiologist: Ena Dawley, MD   Subjective  O/N events: Developed bilateral pneumothorax. Currently has chest tubes in both. Worsening hypoxemia, tachycardia and hypotension.  Mr.Didion was examined and evaluated at bedside this AM. He is currently intubated and sedated. Appear to be in respiratory distress. PCCM changing chest tube bandaging at bedside.  Inpatient Medications    Scheduled Meds:  chlorhexidine gluconate (MEDLINE KIT)  15 mL Mouth Rinse BID   Chlorhexidine Gluconate Cloth  6 each Topical Q0600   [START ON 08/28/2019] Chlorhexidine Gluconate Cloth  6 each Topical Q0600   clonazePAM  2 mg Per Tube Q12H   colchicine  0.6 mg Oral BID   docusate  200 mg Per Tube BID   free water  200 mL Per Tube Q6H   heparin injection (subcutaneous)  5,000 Units Subcutaneous Q8H   insulin aspart  0-20 Units Subcutaneous Q4H   ipratropium-albuterol  3 mL Nebulization QID   mouth rinse  15 mL Mouth Rinse 10 times per day   methylPREDNISolone (SOLU-MEDROL) injection  40 mg Intravenous Q12H   oxyCODONE  10 mg Per Tube Q6H   pantoprazole sodium  40 mg Per Tube Daily   QUEtiapine  50 mg Per Tube Q12H   sennosides  5 mL Per Tube BID   Continuous Infusions:  sodium chloride 10 mL/hr at September 29, 2019 0600   feeding supplement (VITAL HIGH PROTEIN) 1,000 mL (29-Sep-2019 0330)   fentaNYL infusion INTRAVENOUS 200 mcg/hr (09/29/19 0600)   midazolam 10 mg/hr (09-29-2019 0600)   vancomycin Stopped (2019-09-29 0007)   PRN Meds: sodium chloride, acetaminophen **OR** acetaminophen, fentaNYL, metoprolol tartrate, midazolam   Vital Signs    Vitals:   09/29/19 0600 29-Sep-2019 0630 09/29/19 0700 09-29-2019 0715  BP: 112/68 111/67  98/70  Pulse: (!) 101 99  (!) 103  Resp: (!) 40 (!) 37  (!) 45  Temp:   98.9 F (37.2 C)   TempSrc:   Axillary   SpO2: 95% 96%  96%  Weight:      Height:         Intake/Output Summary (Last 24 hours) at 09/29/2019 0717 Last data filed at September 29, 2019 0600 Gross per 24 hour  Intake 4390.46 ml  Output 2275 ml  Net 2115.46 ml   Last 3 Weights 2019-09-29 09/03/2019 09/02/2019  Weight (lbs) 207 lb 7.3 oz 205 lb 0.4 oz 202 lb 6.1 oz  Weight (kg) 94.1 kg 93 kg 91.8 kg      Telemetry   Worsening sinus tachycardia this am - Personally Reviewed  ECG    No new tracings - Personally Reviewed  Physical Exam   Gen: Well-developed, ill-appearing, In Distress HEENT: NCAT head, EtTube in place CV: Tachycardic, decreased cardiac sounds Pulm: Coarse breath sounds throughout, bilateral chest tube in place Abd: Soft, NTND  Extm: ROM intact, Peripheral pulses intact, 1+ pitting edema Skin: Dry, Warm, normal turgor Neuro: AAOx3  Labs    High Sensitivity Troponin:   Recent Labs  Lab 08/28/2019 1838 08/16/2019 2300 08/30/19 0249 08/30/19 0427 08/30/19 0727  TROPONINIHS 17 87* 79* 101* 71*      Chemistry Recent Labs  Lab 09/02/19 0259 09/03/19 0317 09/03/19 0806 09-29-19 0313 29-Sep-2019 0508  NA 143 148* 149* 147* 148*  K 5.6* 5.3* 5.3* 6.0* 5.9*  CL 116* 119*  --  120*  --   CO2 18* 20*  --  20*  --   GLUCOSE 220* 190*  --  208*  --   BUN 39* 50*  --  94*  --   CREATININE 0.98 0.99  --  1.70*  --   CALCIUM 8.3* 8.4*  --  8.1*  --   PROT 5.4* 5.9*  --  6.4*  --   ALBUMIN 1.6* 1.4*  --  1.3*  --   AST 61* 58*  --  61*  --   ALT 33 39  --  36  --   ALKPHOS 105 123  --  124  --   BILITOT 3.8* 3.0*  --  3.3*  --   GFRNONAA >60 >60  --  47*  --   GFRAA >60 >60  --  54*  --   ANIONGAP 9 9  --  7  --      Hematology Recent Labs  Lab 09/02/19 0501 09/03/19 0317 09/03/19 0806 September 07, 2019 0313 09-07-2019 0508  WBC 22.1* 18.0*  --  23.0*  --   RBC 3.92* 3.79*  --  3.81*  --   HGB 12.1* 11.9* 14.3 11.7* 10.9*  HCT 37.0* 35.5* 42.0 37.1* 32.0*  MCV 94.4 93.7  --  97.4  --   MCH 30.9 31.4  --  30.7  --   MCHC 32.7 33.5  --  31.5  --    RDW 15.2 15.3  --  15.7*  --   PLT 194 198  --  213  --     BNPNo results for input(s): BNP, PROBNP in the last 168 hours.   DDimer  Recent Labs  Lab 08/30/19 0249  DDIMER 9.44*     Radiology    Dg Chest Port 1 View  Result Date: 09/03/2019 CLINICAL DATA:  Pneumothorax EXAM: PORTABLE CHEST 1 VIEW COMPARISON:  Portable exam 1428 hours compared to 1151 hours FINDINGS: BILATERAL pigtail thoracostomy tubes present. Tip of endotracheal tube projects 6.2 cm above carina. Nasogastric tube extends into stomach. Normal heart size and mediastinal contours. Diffuse BILATERAL airspace infiltrates greatest in LEFT upper lobe. BILATERAL small pneumothoraces slightly larger bilaterally more so on LEFT. No mediastinal shift or pleural effusion. Bones unremarkable. IMPRESSION: BILATERAL small pneumothoraces despite thoracostomy tubes, both slightly increased, more so on LEFT. Persistent severe diffuse BILATERAL airspace infiltrates greatest in LEFT upper lobe. Findings called to [facility] in MICU on 09/03/2019 at 1439 hrs. Electronically Signed   By: Lavonia Dana M.D.   On: 09/03/2019 14:40   Dg Chest Port 1 View  Result Date: 09/03/2019 CLINICAL DATA:  New left pneumothorax, chest tube placement. EXAM: PORTABLE CHEST 1 VIEW COMPARISON:  09/03/2019 and CT chest 08/30/2019. FINDINGS: Endotracheal tube terminates 6.5 cm above the carina. Nasogastric tube is followed into the stomach. There are bilateral pigtail drains in hemi thoraces, new on the left in the interval. Heart size within normal limits. Patchy peripheral airspace consolidation bilaterally, left greater than right. Marked interval improvement in left-sided pneumothorax with a small amount of residual left basilar pleural air. New associated subcutaneous emphysema along the left lateral chest wall. Small right pneumothorax is unchanged. No definite pleural fluid. IMPRESSION: 1. Interval left chest tube placement with marked improvement in previously  seen left pneumothorax, now very small in size. 2. Stable small right pneumothorax with chest drain in place. 3. Patchy peripheral airspace consolidation, left greater than right, most indicative of septic emboli when compared with 08/30/2019. Electronically Signed   By: Lorin Picket M.D.   On: 09/03/2019 12:05   Dg Chest Port 1 View  Result Date: 09/03/2019  CLINICAL DATA:  Hypoxia. Chest pain and shortness of breath. Endocarditis with pulmonary septic emboli. Pneumothorax. EXAM: PORTABLE CHEST 1 VIEW 10:58 a.m. COMPARISON:  Radiographs dated 09/03/2019 at 7:37 a.m. and 5:29 a.m. FINDINGS: There is a new 40% left pneumothorax primarily at the base. There is collapse of the majority of the left lower lobe. Extensive bilateral peripheral infiltrates remain in the upper lung zones and at the right base. The endotracheal tube, NG tube, and chest tube all appear in good position. Interval further decrease in the now tiny right pneumothorax. Bones are normal. No effusions. Heart size and vascularity are normal. IMPRESSION: New 40% left pneumothorax. Decreased now tiny peripheral right pneumothorax. No change in the extensive bilateral peripheral pulmonary infiltrates. Critical Value/emergent results were called by telephone at the time of interpretation on 09/03/2019 at 11:12 am to [facility] [place] , who verbally acknowledged these results. Electronically Signed   By: Lorriane Shire M.D.   On: 09/03/2019 11:14   Dg Chest Port 1 View  Result Date: 09/03/2019 CLINICAL DATA:  Chest tube placement. EXAM: PORTABLE CHEST 1 VIEW COMPARISON:  Same day. FINDINGS: The heart size and mediastinal contours are within normal limits. Endotracheal and nasogastric tubes are unchanged in position. Interval placement of right pigtail drainage catheter into the right hemithorax. Right pneumothorax is significantly smaller compared to prior exam. Stable bilateral lung opacities are noted consistent with pneumonia. Bony thorax is  unremarkable. The visualized skeletal structures are unremarkable. IMPRESSION: Interval placement of right-sided chest tube. Right-sided pneumothorax is significantly smaller in size. Otherwise stable support apparatus. Stable bilateral lung opacities consistent with pneumonia. Electronically Signed   By: Marijo Conception M.D.   On: 09/03/2019 08:01   Dg Chest Port 1 View  Result Date: 09/03/2019 CLINICAL DATA:  Acute respiratory failure EXAM: PORTABLE CHEST 1 VIEW COMPARISON:  Two days ago FINDINGS: Endotracheal tube tip just below the clavicular heads. The orogastric tube reaches the stomach. New small to moderate right pneumothorax estimated at ~ 20%. There is cavitary pneumonia by CT. Bilateral pneumonia with progression. Small pleural effusions. Normal heart size. These results were called by telephone at the time of interpretation on 09/03/2019 at 7:27 am to ICU RN Lexi , who verbally acknowledged these results and will relay to round Intensivist\. IMPRESSION: 1. New ~20% right pneumothorax. 2. Worsening bilateral pneumonia. Electronically Signed   By: Monte Fantasia M.D.   On: 09/03/2019 07:27    Cardiac Studies   TTE 09/03/19 IMPRESSIONS  1. Left ventricular ejection fraction, by visual estimation, is normal%. The left ventricle has normal function. There is no left ventricular hypertrophy.  2. Global right ventricle has mildly reduced systolic function.The right ventricular size is normal. No increase in right ventricular wall thickness.  3. Left atrial size was normal.  4. Right atrial size was normal.  5. Small pericardial effusion.  6. The mitral valve is grossly normal. not assessed mitral valve regurgitation.  7. The tricuspid valve is grossly normal. Tricuspid valve regurgitation was not assessed by color flow Doppler.  8. The aortic valve is tricuspid Aortic valve regurgitation was not assessed by color flow Doppler. Mild to moderate aortic valve sclerosis/calcification without  any evidence of aortic stenosis.  9. The pulmonic valve was not assessed. Pulmonic valve regurgitation was not assessed by color flow Doppler. 10. The inferior vena cava is normal in size with greater than 50% respiratory variability, suggesting right atrial pressure of 3 mmHg. 11. No evidence of tamponade by echo criteria. Does not appear  changed in comparison to echo report for 08/30/19. 12. The interatrial septum was not assessed  Echo 08/30/19: 1. Left ventricular ejection fraction, by visual estimation, is 60 to 65%. The left ventricle has normal function. There is no left ventricular hypertrophy.  2. Global right ventricle has normal systolic function.The right ventricular size is normal. No increase in right ventricular wall thickness.  3. Left atrial size was normal.  4. Right atrial size was normal.  5. The mitral valve is grossly normal. Trace mitral valve regurgitation.  6. The tricuspid valve is grossly normal. Tricuspid valve regurgitation is trivial.  7. The aortic valve is tricuspid Aortic valve regurgitation was not visualized by color flow Doppler.  8. The pulmonic valve was grossly normal. Pulmonic valve regurgitation is not visualized by color flow Doppler.  9. Normal pulmonary artery systolic pressure. 10. The inferior vena cava is dilated in size with >50% respiratory variability, suggesting right atrial pressure of 8 mmHg. The IVC collapses. 11. Small pericardial effusion. 12. The pericardial effusion is circumferential. Tamponade is not suspected. 13. No evidence of any gross valvular vegetations.   Patient Profile     48 y.o. 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 Hospital course complicated by pericardial effusion and pneumothorax  Assessment & Plan    1. Sepsis, MRSA bacteremiA Mild troponemia in setting of sepsis. Multiple cultures w/ MRSA bacteremia. Currently intubated. No pressor requirement. On vanc. Wbc 22.1->18->23 - Management per  PCCM  2. Pneuomothorax Acute hypoxic respiratory failure Developed bilateral pneumothorax despite low pressure on vent. Chest tube in place x2. Most recent chest-xray w/ resolving pneumothorax but ?air in pericardium. Hypoxemia and tachypnea worsening - C/w Intubation - Chest tube management per PCCM  3. Pericardial effusion 08/30/19 Echocardiogram showing pericardial effusion w/o tamponade. On colchicine. Repeat Echo shows no significant change in effusion. X-ray w/ concern for air in pericardium - C/w colchicine 0.'6mg'$  BID - May need cardiac window if hemodynamic instability worsens  4. AKI Worsening renal fx with decreased urinary output. Creatinine 0.99->1.7 Hyperkalemic at 6.0. Likely due to ATN in setting of sepsis - May need nephro consult if renal fx continues to worsen - Trend renal fx - Avoid nephrotoxic meds when able  For questions or updates, please contact [facility] Please consult [email or web address] for contact info under     Signed, Mosetta Anis, MD  09/22/19, 7:17 AM  PGY-2, [place] Pager: [phone]  Attending attestation to follow

## 2019-09-09 NOTE — Death Summary Note (Signed)
DEATH SUMMARY   Patient Details  Name: Scott Strong MRN: [id number] DOB: [birth year]  Admission/Discharge Information   Admit Date:  09/22/19  Date of Death: Date of Death: 09-28-2019  Time of Death: Time of Death: 02-05-2349  Length of Stay: 05-Feb-2023  Referring Physician: Patient, No Pcp Per   Reason(s) for Hospitalization  MRSA bacteremia 2/2 IVDU  Diagnoses  Preliminary cause of death: IVDU (intravenous drug user) Secondary Diagnoses (including complications and co-morbidities):  Principal Problem:   MRSA bacteremia Active Problems:   Suspected COVID-19 virus infection   Severe sepsis ([place])   Acute infective pericarditis   Respiratory distress   Hemoptysis   Respiratory failure, acute ([place])   Chest tube in place   Septic pulmonary embolism ([facility])   Injection of illicit drug within last 12 months   Heroin use disorder, severe, dependence ([place])   Pressure injury of skin   Pneumothorax   Paroxysmal atrial fibrillation [facility])   Brief Hospital Course (including significant findings, care, treatment, and services provided and events leading to death)  Scott Strong is a 48 y.o. year old male who reportedly has significant past medical history presenting to the ED with complaints of chest pain and shortness of breath x3 days.  Today he started coughing up blood.  Reports having pleuritic chest pain which is worse when lying down and better when sitting up and leaning forward.  He has been having fevers and generalized body aches.  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  Patient is very ill-appearing and no additional history could be obtained from him.  ED Course: Afebrile.  Tachycardic and tachypneic.  Initially hypotensive, blood pressure now improved after IV fluid resuscitation.  Not hypoxic.  White blood cell count 78.2 with neutrophilic predominance.  Lactic acid 2.8 >2.6.  Blood culture x2 pending.  UA with negative nitrite, moderate amount of leukocytes, greater than 50 WBCs, and no  bacteria on microscopic examination.  Urine culture pending.  SARS-CoV-2 test pending.  Corrected sodium 129.  BUN 41, creatinine 2.7.  No prior labs for comparison.  Lipase 173.  AST 65, ALT 66, alk phos 128, and T bili 4.8.  INR 1.4.  High-sensitivity troponin 17 >87.  EKG with diffuse ST elevations.  Case was discussed with cardiology and these findings are thought to be related to acute viral pericarditis.  Due to suspicion for COVID-19 viral infection, inflammatory markers ordered and currently pending.  Chest x-ray with ill-defined bilateral nodular opacities.  Chest CT without contrast showing multifocal patchy airspace opacities throughout both lungs suspicious for atypical infection as well as multifocal metastatic disease although no primary malignancy identified on CT chest/abdomen/pelvis.  Chest CT showing small to moderate right and trace left pleural effusion.  In addition, showing a small pericardial effusion.  Mediastinal adenopathy, likely reactive.  CT abdomen pelvis showing no acute intra-abdominal or pelvic pathology. Patient received colchicine, fentanyl, morphine, Zofran, ceftriaxone, azithromycin, and 2.5 liter LR boluses.   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 He became hypotensive req pressors that were eventually weaned but then developed b/l ptx req 4 chest tubes in total. On 09-28-2023 pt's bp continued to fall was started and uptitrated on pressors. His renal function declined and he became hyperkalemic. CRRT was started and unfortunately all these interventions were not enough and around 2340 on Sep 28, 2019 pt suffered cardiac arrest. acls started (  see code documentation) and pt was pronounced dead at 2345.   Family was at bedside.     Pertinent Labs and Studies  Significant Diagnostic Studies Ct Abdomen Pelvis Wo  Contrast  Result Date: 08/28/2019 CLINICAL DATA:  Abdominal pain and shortness of breath EXAM: CT ABDOMEN AND PELVIS WITHOUT CONTRAST TECHNIQUE: Multidetector CT imaging of the abdomen and pelvis was performed following the standard protocol without IV contrast. COMPARISON:  None. FINDINGS: Cardiovascular: There is a small pericardial effusion. Coronary artery calcifications are seen. Scattered mild atherosclerosis is noted. 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 Mediastinum/Nodes: Limited due to the lack of intravenous contrast, however there does appear to be scattered subcarinal and prevascular lymph nodes seen. The thyroid gland, trachea and esophagus demonstrate no significant findings. Lungs/Pleura: There is multifocal extensive patchy airspace opacities seen throughout both lungs. There is also mild increased interstitial thickening at the lung bases. A small to moderate right and trace left pleural effusion is seen. Upper abdomen: The visualized portion of the upper abdomen is unremarkable. Musculoskeletal/Chest wall: There is no chest wall mass or suspicious osseous finding. No acute osseous abnormality Lower chest: The visualized heart size within normal limits. No pericardial fluid/thickening. No hiatal hernia. The visualized portions of the lungs are clear. Hepatobiliary: Although limited due to the lack of intravenous contrast, normal in appearance without gross focal abnormality. No evidence of calcified gallstones or biliary ductal dilatation. Pancreas:  Unremarkable.  No surrounding inflammatory changes. Spleen: Normal in size. Although limited due to the lack of intravenous contrast, normal in appearance. Adrenals/Urinary Tract: Both adrenal glands appear normal. The kidneys and collecting system appear normal without evidence of urinary tract calculus or hydronephrosis. Bladder is unremarkable. Stomach/Bowel: The stomach, small bowel, and colon are normal in appearance. No  inflammatory changes or obstructive findings. appendix is normal. Vascular/Lymphatic: There are no enlarged abdominal or pelvic lymph nodes. Scattered aortic atherosclerotic calcifications are seen without aneurysmal dilatation. Reproductive: The prostate is unremarkable. Other: No evidence of abdominal wall mass or hernia. Musculoskeletal: No acute or significant osseous findings. IMPRESSION: Multifocal patchy airspace opacities seen throughout both lungs. There are a spectrum of findings in the lungs which can be seen with acute atypical infection, as well as multifocal metastatic disease (no definite primary malignancy identified). Small to moderate right and trace left pleural effusion. Mediastinal adenopathy, likely reactive No acute intra-abdominal or pelvic pathology. Aortic Atherosclerosis (ICD10-I70.0). Electronically Signed   By: Prudencio Pair M.D.   On: 08/24/2019 22:46   Ct Chest Wo Contrast  Result Date: 08/14/2019 CLINICAL DATA:  Abdominal pain and shortness of breath EXAM: CT ABDOMEN AND PELVIS WITHOUT CONTRAST TECHNIQUE: Multidetector CT imaging of the abdomen and pelvis was performed following the standard protocol without IV contrast. COMPARISON:  None. FINDINGS: Cardiovascular: There is a small pericardial effusion. Coronary artery calcifications are seen. Scattered mild atherosclerosis is noted. 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 Mediastinum/Nodes: Limited due to the lack of intravenous contrast, however there does appear to be scattered subcarinal and prevascular lymph nodes seen. The thyroid gland, trachea and esophagus demonstrate no significant findings. Lungs/Pleura: There is multifocal extensive patchy airspace opacities seen throughout both lungs. There is also mild increased interstitial thickening at the lung bases. A small to moderate right and trace left pleural effusion is seen. Upper abdomen: The visualized portion of the upper abdomen is unremarkable.  Musculoskeletal/Chest wall: There is no chest wall mass or suspicious osseous finding. No acute osseous abnormality Lower chest: The visualized heart  size within normal limits. No pericardial fluid/thickening. No hiatal hernia. The visualized portions of the lungs are clear. Hepatobiliary: Although limited due to the lack of intravenous contrast, normal in appearance without gross focal abnormality. No evidence of calcified gallstones or biliary ductal dilatation. Pancreas:  Unremarkable.  No surrounding inflammatory changes. Spleen: Normal in size. Although limited due to the lack of intravenous contrast, normal in appearance. Adrenals/Urinary Tract: Both adrenal glands appear normal. The kidneys and collecting system appear normal without evidence of urinary tract calculus or hydronephrosis. Bladder is unremarkable. Stomach/Bowel: The stomach, small bowel, and colon are normal in appearance. No inflammatory changes or obstructive findings. appendix is normal. Vascular/Lymphatic: There are no enlarged abdominal or pelvic lymph nodes. Scattered aortic atherosclerotic calcifications are seen without aneurysmal dilatation. Reproductive: The prostate is unremarkable. Other: No evidence of abdominal wall mass or hernia. Musculoskeletal: No acute or significant osseous findings. IMPRESSION: Multifocal patchy airspace opacities seen throughout both lungs. There are a spectrum of findings in the lungs which can be seen with acute atypical infection, as well as multifocal metastatic disease (no definite primary malignancy identified). Small to moderate right and trace left pleural effusion. Mediastinal adenopathy, likely reactive No acute intra-abdominal or pelvic pathology. Aortic Atherosclerosis (ICD10-I70.0). Electronically Signed   By: Prudencio Pair M.D.   On: 08/10/2019 22:46   Ct Angio Chest Pe W Or Wo Contrast  Result Date: 08/30/2019 CLINICAL DATA:  Intermediate probability for pulmonary embolism. Positive  D-dimer EXAM: CT ANGIOGRAPHY CHEST WITH CONTRAST TECHNIQUE: Multidetector CT imaging of the chest was performed using the standard protocol during bolus administration of intravenous contrast. Multiplanar CT image reconstructions and MIPs were obtained to evaluate the vascular anatomy. CONTRAST:  61m OMNIPAQUE IOHEXOL 350 MG/ML SOLN COMPARISON:  Noncontrast CT from yesterday FINDINGS: Cardiovascular: Normal heart size. Small pericardial effusion. There is likely smooth serosal enhancement, certainty limited by contrast timing. Patient has history of infective endocarditis per the chart. Negative aorta. No pulmonary embolism is seen. Coronary atherosclerosis that is notable for age. Mediastinum/Nodes: Negative for adenopathy or collection. Lungs/Pleura: Irregularly marginated rounded nodules and consolidative opacities in the bilateral lungs with areas of central cavitation that are non drainable. Bilateral small pleural effusion with scalloping best seen on the right, progressed. No pneumothorax. Upper Abdomen: Negative Musculoskeletal: No evidence of spinal infection. Review of the MIP images confirms the above findings. IMPRESSION: 1. Multifocal cavitary pneumonia consistent with septic emboli. No emboli large enough for visualization by CTA. 2. Small pleural effusions that are complex and increased from yesterday, right larger than left. 3. Small pericardial effusion with possible pericarditis. Electronically Signed   By: JMonte FantasiaM.D.   On: 08/30/2019 06:38   Dg Chest Port 1 View  Result Date: 111-25-20CLINICAL DATA:  Known renal failure, status post temporary dialysis catheter placement EXAM: PORTABLE CHEST 1 VIEW COMPARISON:  Film from earlier in the same day. FINDINGS: Cardiac shadow is stable. Endotracheal tube and gastric catheter are again seen and stable. Left jugular central line is again seen at the cavoatrial junction. New right jugular temporary dialysis catheter is seen in the mid  superior vena cava. Minimal residual pneumothorax is noted bilaterally. Bilateral chest tubes are seen and stable. Considerable subcutaneous emphysema is noted. Bilateral parenchymal opacities are noted stable from the previous exams. IMPRESSION: Interval placement of right temporary dialysis catheter via the jugular vein. Small pneumothoraces are noted relatively stable from the prior exam with chest tubes in place. Tubes and lines as described above. Stable bilateral parenchymal opacities  are seen. Electronically Signed   By: Inez Catalina M.D.   On: September 23, 2019 19:46   Dg Chest Port 1 View  Result Date: 09/23/2019 CLINICAL DATA:  New chest tube insertion. EXAM: PORTABLE CHEST 1 VIEW COMPARISON:  09/03/2019. FINDINGS: Endotracheal tube, NG tube, bilateral pigtail chest tubes, right standard chest 2 in stable position. Interim improvement of left pneumothorax with mild residual. Stable small right pneumothorax. Progressive left chest wall subcutaneous emphysema. Decreased right chest wall subcutaneous emphysema. Severe bilateral pulmonary infiltrates again noted. Heart size stable. IMPRESSION: 1. Lines and tubes including 2 right chest tubes and 1 left chest tube in stable position. 2. Interim improvement of left pneumothorax with mild residual. Stable small right pneumothorax. Progressive left chest wall subcutaneous emphysema. Decreased right chest wall subcutaneous emphysema. 3.  Severe bilateral pulmonary infiltrates unchanged. Electronically Signed   By: Marcello Moores  Register   On: Sep 23, 2019 15:55   Dg Chest Port 1 View  Result Date: 2019-09-23 CLINICAL DATA:  Chest tube. EXAM: PORTABLE CHEST 1 VIEW COMPARISON:  Same day. FINDINGS: The heart size and mediastinal contours are within normal limits. Endotracheal and nasogastric tubes are unchanged in position. Bilateral chest tubes are noted. Right-sided pneumothorax is significantly decreased in size compared to prior exam. Left pneumothorax is stable.  Subcutaneous emphysema is seen over right lateral chest wall. Bilateral patchy airspace opacities are noted concerning for multifocal pneumonia. The visualized skeletal structures are unremarkable. IMPRESSION: Endotracheal and nasogastric tubes are unchanged in position. Stable bilateral chest tubes are noted. Right pneumothorax is significantly improved compared to prior exam. Left pneumothorax is stable. Subcutaneous emphysema is now seen over right lateral chest wall. Stable bilateral lung opacities are noted consistent multifocal pneumonia. Electronically Signed   By: Marijo Conception M.D.   On: 09-23-2019 11:36   Dg Chest Port 1 View  Result Date: 23-Sep-2019 CLINICAL DATA:  Tachycardia.  Hypoxia. EXAM: PORTABLE CHEST 1 VIEW COMPARISON:  09/03/2019. FINDINGS: Endotracheal tube and NG tube in stable position. Bilateral chest tubes in stable position. Heart size normal. Diffuse bilateral infiltrates and atelectatic changes are again noted. Bilateral pneumothoraces again noted. These have slightly increased in size from prior exam. IMPRESSION: 1. Bilateral chest tubes in stable position. Bilateral pneumothoraces again noted. These have increased slightly in size from prior exam. Endotracheal tube and NG tube in stable position. 2. Diffuse bilateral pulmonary infiltrates and atelectatic changes again noted without interim change. Electronically Signed   By: Marcello Moores  Register   On: 09/23/2019 09:34   Dg Chest Port 1 View  Result Date: 09/03/2019 CLINICAL DATA:  Pneumothorax EXAM: PORTABLE CHEST 1 VIEW COMPARISON:  Portable exam 1428 hours compared to 1151 hours FINDINGS: BILATERAL pigtail thoracostomy tubes present. Tip of endotracheal tube projects 6.2 cm above carina. Nasogastric tube extends into stomach. Normal heart size and mediastinal contours. Diffuse BILATERAL airspace infiltrates greatest in LEFT upper lobe. BILATERAL small pneumothoraces slightly larger bilaterally more so on LEFT. No mediastinal  shift or pleural effusion. Bones unremarkable. IMPRESSION: BILATERAL small pneumothoraces despite thoracostomy tubes, both slightly increased, more so on LEFT. Persistent severe diffuse BILATERAL airspace infiltrates greatest in LEFT upper lobe. Findings called to [facility] in MICU on 09/03/2019 at 1439 hrs. Electronically Signed   By: Lavonia Dana M.D.   On: 09/03/2019 14:40   Dg Chest Port 1 View  Result Date: 09/03/2019 CLINICAL DATA:  New left pneumothorax, chest tube placement. EXAM: PORTABLE CHEST 1 VIEW COMPARISON:  09/03/2019 and CT chest 08/30/2019. FINDINGS: Endotracheal tube terminates 6.5 cm above  the carina. Nasogastric tube is followed into the stomach. There are bilateral pigtail drains in hemi thoraces, new on the left in the interval. Heart size within normal limits. Patchy peripheral airspace consolidation bilaterally, left greater than right. Marked interval improvement in left-sided pneumothorax with a small amount of residual left basilar pleural air. New associated subcutaneous emphysema along the left lateral chest wall. Small right pneumothorax is unchanged. No definite pleural fluid. IMPRESSION: 1. Interval left chest tube placement with marked improvement in previously seen left pneumothorax, now very small in size. 2. Stable small right pneumothorax with chest drain in place. 3. Patchy peripheral airspace consolidation, left greater than right, most indicative of septic emboli when compared with 08/30/2019. Electronically Signed   By: Lorin Picket M.D.   On: 09/03/2019 12:05   Dg Chest Port 1 View  Result Date: 09/03/2019 CLINICAL DATA:  Hypoxia. Chest pain and shortness of breath. Endocarditis with pulmonary septic emboli. Pneumothorax. EXAM: PORTABLE CHEST 1 VIEW 10:58 a.m. COMPARISON:  Radiographs dated 09/03/2019 at 7:37 a.m. and 5:29 a.m. FINDINGS: There is a new 40% left pneumothorax primarily at the base. There is collapse of the majority of the left lower lobe. Extensive  bilateral peripheral infiltrates remain in the upper lung zones and at the right base. The endotracheal tube, NG tube, and chest tube all appear in good position. Interval further decrease in the now tiny right pneumothorax. Bones are normal. No effusions. Heart size and vascularity are normal. IMPRESSION: New 40% left pneumothorax. Decreased now tiny peripheral right pneumothorax. No change in the extensive bilateral peripheral pulmonary infiltrates. Critical Value/emergent results were called by telephone at the time of interpretation on 09/03/2019 at 11:12 am to [facility], [place] , who verbally acknowledged these results. Electronically Signed   By: Lorriane Shire M.D.   On: 09/03/2019 11:14   Dg Chest Port 1 View  Result Date: 09/03/2019 CLINICAL DATA:  Chest tube placement. EXAM: PORTABLE CHEST 1 VIEW COMPARISON:  Same day. FINDINGS: The heart size and mediastinal contours are within normal limits. Endotracheal and nasogastric tubes are unchanged in position. Interval placement of right pigtail drainage catheter into the right hemithorax. Right pneumothorax is significantly smaller compared to prior exam. Stable bilateral lung opacities are noted consistent with pneumonia. Bony thorax is unremarkable. The visualized skeletal structures are unremarkable. IMPRESSION: Interval placement of right-sided chest tube. Right-sided pneumothorax is significantly smaller in size. Otherwise stable support apparatus. Stable bilateral lung opacities consistent with pneumonia. Electronically Signed   By: Marijo Conception M.D.   On: 09/03/2019 08:01   Dg Chest Port 1 View  Result Date: 09/03/2019 CLINICAL DATA:  Acute respiratory failure EXAM: PORTABLE CHEST 1 VIEW COMPARISON:  Two days ago FINDINGS: Endotracheal tube tip just below the clavicular heads. The orogastric tube reaches the stomach. New small to moderate right pneumothorax estimated at ~ 20%. There is cavitary pneumonia by CT. Bilateral pneumonia with progression.  Small pleural effusions. Normal heart size. These results were called by telephone at the time of interpretation on 09/03/2019 at 7:27 am to ICU RN Lexi , who verbally acknowledged these results and will relay to round Intensivist\. IMPRESSION: 1. New ~20% right pneumothorax. 2. Worsening bilateral pneumonia. Electronically Signed   By: Monte Fantasia M.D.   On: 09/03/2019 07:27   Portable Chest Xray  Result Date: 09/01/2019 CLINICAL DATA:  Hypoxia EXAM: PORTABLE CHEST 1 VIEW COMPARISON:  August 31, 2019 FINDINGS: Endotracheal tube tip is 3.3 cm above the carina. Nasogastric tube tip and side port  are below the diaphragm. No pneumothorax. There is airspace opacity in the left upper lobe with mild consolidation in this area. There is also persistent airspace opacity in the right mid lung. There is been partial clearing of airspace opacity from the right upper lobe. There is increased consolidation in the left lower lobe with small left pleural effusion. There is also a small right pleural effusion which appear stable. There is cardiomegaly with pulmonary vascularity within normal limits. No adenopathy. No bone lesions. IMPRESSION: Tube positions as described without pneumothorax. Bilateral pleural effusions. Areas of airspace opacity bilaterally with increase consolidation in the left lower lobe compared to 1 day prior. Stable opacity in the left upper lobe and right mid lung region with partial clearing from the right upper lobe. Stable cardiac prominence. Electronically Signed   By: Lowella Grip III M.D.   On: 09/01/2019 11:29   Dg Chest Port 1 View  Result Date: 08/31/2019 CLINICAL DATA:  Intubation. EXAM: PORTABLE CHEST 1 VIEW COMPARISON:  Radiograph and CT yesterday FINDINGS: Endotracheal tube tip 4.1 cm from the carina. Enteric tube tip and side-port below the diaphragm in the stomach. Heterogeneous bilateral pulmonary opacities, cavitary components on CT are not well demonstrated  radiographically. Bilateral pleural effusions. Mild enlargement of the cardiopericardial silhouette, small pericardial effusion on CT. No pneumothorax. IMPRESSION: 1. Endotracheal tube tip 4.1 cm from the carina. Enteric tube tip and side-port below the diaphragm in the stomach. 2. Multifocal pneumonia and bilateral pleural effusions, not significantly changed from yesterday. Electronically Signed   By: Keith Rake M.D.   On: 08/31/2019 01:59   Dg Chest Port 1 View  Result Date: 08/30/2019 CLINICAL DATA:  Respiratory failure EXAM: PORTABLE CHEST 1 VIEW COMPARISON:  08/12/2019, CT chest, 08/30/2019, 6:14 a.m. FINDINGS: No significant interval change in extensive bilateral heterogeneous and nodular airspace opacities with small, layering bilateral pleural effusions. Enlargement of the cardiac silhouette, in keeping with pericardial effusion identified by prior CT. IMPRESSION: 1. No significant interval change in extensive bilateral heterogeneous and nodular airspace opacities with small, layering bilateral pleural effusions. 2. Enlargement of the cardiac silhouette, in keeping with pericardial effusion identified by prior CT. Electronically Signed   By: Eddie Candle M.D.   On: 08/30/2019 16:42   Dg Chest Port 1 View  Result Date: 08/23/2019 CLINICAL DATA:  Right-sided chest pain and shortness of breath for 3 days. Diaphoresis. EXAM: PORTABLE CHEST 1 VIEW COMPARISON:  None. FINDINGS: Heart size is within normal limits allowing for low lung volumes. Bibasilar atelectasis noted. Multifocal ill-defined nodular opacities are seen in both lungs which could be infectious, inflammatory, or neoplastic in etiology. IMPRESSION: Low lung volumes with ill-defined bilateral nodular opacities, which could be infectious, inflammatory or neoplastic in etiology. Recommend further evaluation with two-view chest at full inspiration, or chest CT. Electronically Signed   By: Marlaine Hind M.D.   On: 08/31/2019 19:39   US  Abdomen Limited Ruq  Result Date: 08/30/2019 CLINICAL DATA:  Elevated LFTs EXAM: ULTRASOUND ABDOMEN LIMITED RIGHT UPPER QUADRANT COMPARISON:  None. FINDINGS: Gallbladder: No gallstones or wall thickening visualized. No sonographic Murphy sign noted by sonographer. Common bile duct: Diameter: 3.1 mm. Liver: No focal lesion identified. Within normal limits in parenchymal echogenicity. Portal vein is patent on color Doppler imaging with normal direction of blood flow towards the liver. Other: Small right pleural effusion is noted similar to that seen on prior CT examination. IMPRESSION: Unremarkable right upper quadrant ultrasound. Right pleural effusion similar to that seen on prior exam.  Electronically Signed   By: Inez Catalina M.D.   On: 08/30/2019 08:55    Microbiology Recent Results (from the past 240 hour(s))  Culture, blood (Routine x 2)     Status: Abnormal   Collection Time: 09/01/2019  6:30 PM   Specimen: BLOOD  Result Value Ref Range Status   Specimen Description BLOOD RIGHT ANTECUBITAL  Final   Special Requests   Final    BOTTLES DRAWN AEROBIC AND ANAEROBIC Blood Culture adequate volume   Culture  Setup Time   Final    IN BOTH AEROBIC AND ANAEROBIC BOTTLES GRAM POSITIVE COCCI CRITICAL RESULT CALLED TO, READ BACK BY AND VERIFIED WITH: G ABBOTT PHARMD 08/30/19 0717 JDW    Culture (A)  Final    STAPHYLOCOCCUS AUREUS SUSCEPTIBILITIES PERFORMED ON PREVIOUS CULTURE WITHIN THE LAST 5 DAYS. Performed at [facility], [place] [address], [place], [place] [address]    Report Status 09/01/2019 FINAL  Final  Culture, blood (Routine x 2)     Status: Abnormal   Collection Time: 08/19/2019  6:56 PM   Specimen: BLOOD RIGHT HAND  Result Value Ref Range Status   Specimen Description BLOOD RIGHT HAND  Final   Special Requests   Final    BOTTLES DRAWN AEROBIC ONLY Blood Culture results may not be optimal due to an inadequate volume of blood received in culture bottles Performed at [facility] [address], [place], [place] [address]    Culture  Setup Time   Final    GRAM POSITIVE COCCI IN CLUSTERS AEROBIC BOTTLE ONLY CRITICAL RESULT CALLED TO, READ BACK BY AND VERIFIED WITH: Leonie Green PHARMD, AT [id number] 08/30/19    Culture METHICILLIN RESISTANT STAPHYLOCOCCUS AUREUS (A)  Final   Report Status 09/01/2019 FINAL  Final   Organism ID, Bacteria METHICILLIN RESISTANT STAPHYLOCOCCUS AUREUS  Final      Susceptibility   Methicillin resistant staphylococcus aureus - MIC*    CIPROFLOXACIN >=8 RESISTANT Resistant     ERYTHROMYCIN >=8 RESISTANT Resistant     GENTAMICIN <=0.5 SENSITIVE Sensitive     OXACILLIN >=4 RESISTANT Resistant     TETRACYCLINE <=1 SENSITIVE Sensitive     VANCOMYCIN <=0.5 SENSITIVE Sensitive     TRIMETH/SULFA >=320 RESISTANT Resistant     CLINDAMYCIN <=0.25 SENSITIVE Sensitive     RIFAMPIN <=0.5 SENSITIVE Sensitive     Inducible Clindamycin NEGATIVE Sensitive     * METHICILLIN RESISTANT STAPHYLOCOCCUS AUREUS  Blood Culture ID Panel (Reflexed)     Status: Abnormal   Collection Time: 08/09/2019  6:56 PM  Result Value Ref Range Status   Enterococcus species NOT DETECTED NOT DETECTED Final   Listeria monocytogenes NOT DETECTED NOT DETECTED Final   Staphylococcus species DETECTED (A) NOT DETECTED Final    Comment: CRITICAL RESULT CALLED TO, READ BACK BY AND VERIFIED WITH: Leonie Green PharmD 10:30 08/29/29 ([email or web address])    Staphylococcus aureus (BCID) DETECTED (A) NOT DETECTED Final    Comment: Methicillin (oxacillin)-resistant Staphylococcus aureus (MRSA). MRSA is predictably resistant to beta-lactam antibiotics (except ceftaroline). Preferred therapy is vancomycin unless clinically contraindicated. Patient requires contact precautions if  hospitalized. CRITICAL RESULT CALLED TO, READ BACK BY AND VERIFIED WITH: Leonie Green PharmD 10:30 08/30/19 ([email or web address])    Methicillin resistance DETECTED (A) NOT DETECTED Final    Comment: CRITICAL RESULT CALLED TO, READ BACK BY  AND VERIFIED WITH: Leonie Green PharmD 10:30 08/30/19 ([email or web address])    Streptococcus species NOT DETECTED NOT DETECTED Final   Streptococcus agalactiae NOT DETECTED NOT  DETECTED Final   Streptococcus pneumoniae NOT DETECTED NOT DETECTED Final   Streptococcus pyogenes NOT DETECTED NOT DETECTED Final   Acinetobacter baumannii NOT DETECTED NOT DETECTED Final   Enterobacteriaceae species NOT DETECTED NOT DETECTED Final   Enterobacter cloacae complex NOT DETECTED NOT DETECTED Final   Escherichia coli NOT DETECTED NOT DETECTED Final   Klebsiella oxytoca NOT DETECTED NOT DETECTED Final   Klebsiella pneumoniae NOT DETECTED NOT DETECTED Final   Proteus species NOT DETECTED NOT DETECTED Final   Serratia marcescens NOT DETECTED NOT DETECTED Final   Haemophilus influenzae NOT DETECTED NOT DETECTED Final   Neisseria meningitidis NOT DETECTED NOT DETECTED Final   Pseudomonas aeruginosa NOT DETECTED NOT DETECTED Final   Candida albicans NOT DETECTED NOT DETECTED Final   Candida glabrata NOT DETECTED NOT DETECTED Final   Candida krusei NOT DETECTED NOT DETECTED Final   Candida parapsilosis NOT DETECTED NOT DETECTED Final   Candida tropicalis NOT DETECTED NOT DETECTED Final    Comment: Performed at [facility], [place] [address] [place], [place] [address]  SARS CORONAVIRUS 2 (TAT 6-24 HRS) Nasopharyngeal Nasopharyngeal Swab     Status: None   Collection Time: 08/20/2019 10:11 PM   Specimen: Nasopharyngeal Swab  Result Value Ref Range Status   SARS Coronavirus 2 NEGATIVE NEGATIVE Final    Comment: (NOTE) SARS-CoV-2 target nucleic acids are NOT DETECTED. The SARS-CoV-2 RNA is generally detectable in upper and lower respiratory specimens during the acute phase of infection. Negative results do not preclude SARS-CoV-2 infection, do not rule out co-infections with other pathogens, and should not be used as the sole basis for treatment or other patient management decisions. Negative results must be combined  with clinical observations, patient history, and epidemiological information. The expected result is Negative. Fact Sheet for Patients: SugarRoll.be Fact Sheet for Healthcare Providers: https://www.woods-mathews.com/ This test is not yet approved or cleared by the [place] FDA and  has been authorized for detection and/or diagnosis of SARS-CoV-2 by FDA under an Emergency Use Authorization (EUA). This EUA will remain  in effect (meaning this test can be used) for the duration of the COVID-19 declaration under Section 56 4(b)(1) of the Act, 21 U.S.C. section 360bbb-3(b)(1), unless the authorization is terminated or revoked sooner. Performed at [facility], [place] [address], [place], [place] [address]   Urine culture     Status: Abnormal   Collection Time: 08/19/2019 10:15 PM   Specimen: In/Out Cath Urine  Result Value Ref Range Status   Specimen Description IN/OUT CATH URINE  Final   Special Requests   Final    NONE Performed at [facility], [place] [address], [place], [place] [address]    Culture (A)  Final    3,000 COLONIES/mL METHICILLIN RESISTANT STAPHYLOCOCCUS AUREUS   Report Status 09/01/2019 FINAL  Final   Organism ID, Bacteria METHICILLIN RESISTANT STAPHYLOCOCCUS AUREUS (A)  Final      Susceptibility   Methicillin resistant staphylococcus aureus - MIC*    CIPROFLOXACIN >=8 RESISTANT Resistant     GENTAMICIN <=0.5 SENSITIVE Sensitive     NITROFURANTOIN <=16 SENSITIVE Sensitive     OXACILLIN >=4 RESISTANT Resistant     TETRACYCLINE <=1 SENSITIVE Sensitive     VANCOMYCIN 1 SENSITIVE Sensitive     TRIMETH/SULFA >=320 RESISTANT Resistant     CLINDAMYCIN <=0.25 SENSITIVE Sensitive     RIFAMPIN <=0.5 SENSITIVE Sensitive     Inducible Clindamycin NEGATIVE Sensitive     * 3,000 COLONIES/mL METHICILLIN RESISTANT STAPHYLOCOCCUS  AUREUS  SARS CORONAVIRUS 2 (TAT 6-24 HRS) Nasopharyngeal Nasopharyngeal Swab     Status: None    Collection Time: 08/30/19  5:38 AM   Specimen: Nasopharyngeal Swab  Result Value Ref Range Status   SARS Coronavirus 2 NEGATIVE NEGATIVE Final    Comment: (NOTE) SARS-CoV-2 target nucleic acids are NOT DETECTED. The SARS-CoV-2 RNA is generally detectable in upper and lower respiratory specimens during the acute phase of infection. Negative results do not preclude SARS-CoV-2 infection, do not rule out co-infections with other pathogens, and should not be used as the sole basis for treatment or other patient management decisions. Negative results must be combined with clinical observations, patient history, and epidemiological information. The expected result is Negative. Fact Sheet for Patients: SugarRoll.be Fact Sheet for Healthcare Providers: https://www.woods-mathews.com/ This test is not yet approved or cleared by the [place] FDA and  has been authorized for detection and/or diagnosis of SARS-CoV-2 by FDA under an Emergency Use Authorization (EUA). This EUA will remain  in effect (meaning this test can be used) for the duration of the COVID-19 declaration under Section 56 4(b)(1) of the Act, 21 U.S.C. section 360bbb-3(b)(1), unless the authorization is terminated or revoked sooner. Performed at [facility], [place] [address], [place], [place] [address]   Culture, respiratory     Status: None   Collection Time: 08/30/19  5:39 AM   Specimen: SPU  Result Value Ref Range Status   Specimen Description SPUTUM  Final   Special Requests NONE  Final   Gram Stain   Final    NO WBC SEEN NO ORGANISMS SEEN Performed at [facility], [place] [address] [place], [place] [address]    Culture FEW METHICILLIN RESISTANT STAPHYLOCOCCUS AUREUS  Final   Report Status 09/01/2019 FINAL  Final   Organism ID, Bacteria METHICILLIN RESISTANT STAPHYLOCOCCUS AUREUS  Final      Susceptibility   Methicillin resistant staphylococcus aureus - MIC*     CIPROFLOXACIN >=8 RESISTANT Resistant     ERYTHROMYCIN >=8 RESISTANT Resistant     GENTAMICIN <=0.5 SENSITIVE Sensitive     OXACILLIN >=4 RESISTANT Resistant     TETRACYCLINE <=1 SENSITIVE Sensitive     VANCOMYCIN 1 SENSITIVE Sensitive     TRIMETH/SULFA >=320 RESISTANT Resistant     CLINDAMYCIN <=0.25 SENSITIVE Sensitive     RIFAMPIN <=0.5 SENSITIVE Sensitive     Inducible Clindamycin NEGATIVE Sensitive     * FEW METHICILLIN RESISTANT STAPHYLOCOCCUS AUREUS  Respiratory Panel by PCR     Status: Abnormal   Collection Time: 08/30/19  7:27 AM   Specimen: Nasopharyngeal Swab; Respiratory  Result Value Ref Range Status   Adenovirus NOT DETECTED NOT DETECTED Final   Coronavirus 229E NOT DETECTED NOT DETECTED Final    Comment: (NOTE) The Coronavirus on the Respiratory Panel, DOES NOT test for the novel  Coronavirus (2019 nCoV)    Coronavirus HKU1 NOT DETECTED NOT DETECTED Final   Coronavirus NL63 NOT DETECTED NOT DETECTED Final   Coronavirus OC43 NOT DETECTED NOT DETECTED Final   Metapneumovirus NOT DETECTED NOT DETECTED Final   Rhinovirus / Enterovirus DETECTED (A) NOT DETECTED Final   Influenza A NOT DETECTED NOT DETECTED Final   Influenza B NOT DETECTED NOT DETECTED Final   Parainfluenza Virus 1 NOT DETECTED NOT DETECTED Final   Parainfluenza Virus 2 NOT DETECTED NOT DETECTED Final   Parainfluenza Virus 3 NOT DETECTED NOT DETECTED Final   Parainfluenza Virus 4 NOT DETECTED NOT DETECTED Final   Respiratory  Syncytial Virus NOT DETECTED NOT DETECTED Final   Bordetella pertussis NOT DETECTED NOT DETECTED Final   Chlamydophila pneumoniae NOT DETECTED NOT DETECTED Final   Mycoplasma pneumoniae NOT DETECTED NOT DETECTED Final    Comment: Performed at [facility], [place] [address], [place], [place] [address]  MRSA PCR Screening     Status: None   Collection Time: 08/30/19  3:02 PM   Specimen: Nasopharyngeal  Result Value Ref Range Status   MRSA by PCR NEGATIVE NEGATIVE Final     Comment:        The GeneXpert MRSA Assay (FDA approved for NASAL specimens only), is one component of a comprehensive MRSA colonization surveillance program. It is not intended to diagnose MRSA infection nor to guide or monitor treatment for MRSA infections. Performed at [facility], [place] [address], [place], [place] [address]   Culture, blood (routine x 2)     Status: Abnormal (Preliminary result)   Collection Time: 08/31/19  5:35 AM   Specimen: BLOOD RIGHT HAND  Result Value Ref Range Status   Specimen Description BLOOD RIGHT HAND  Final   Special Requests   Final    BOTTLES DRAWN AEROBIC ONLY Blood Culture results may not be optimal due to an inadequate volume of blood received in culture bottles   Culture  Setup Time   Final    GRAM POSITIVE COCCI AEROBIC BOTTLE ONLY CRITICAL VALUE NOTED.  VALUE IS CONSISTENT WITH PREVIOUSLY REPORTED AND CALLED VALUE.    Culture (A)  Final    STAPHYLOCOCCUS AUREUS SUSCEPTIBILITIES PERFORMED ON PREVIOUS CULTURE WITHIN THE LAST 5 DAYS. Performed at [facility], [place] [address] [place], [place] [address]    Report Status PENDING  Incomplete  Culture, blood (routine x 2)     Status: Abnormal   Collection Time: 08/31/19  5:40 AM   Specimen: BLOOD LEFT HAND  Result Value Ref Range Status   Specimen Description BLOOD LEFT HAND  Final   Special Requests   Final    BOTTLES DRAWN AEROBIC ONLY Blood Culture adequate volume   Culture  Setup Time   Final    GRAM POSITIVE COCCI AEROBIC BOTTLE ONLY CRITICAL VALUE NOTED.  VALUE IS CONSISTENT WITH PREVIOUSLY REPORTED AND CALLED VALUE.    Culture (A)  Final    STAPHYLOCOCCUS AUREUS SUSCEPTIBILITIES PERFORMED ON PREVIOUS CULTURE WITHIN THE LAST 5 DAYS. Performed at [facility], [place] [address], [place], [place] [address]    Report Status 09/01/2019 FINAL  Final  Culture, blood (Routine X 2) w Reflex to ID Panel     Status: None (Preliminary result)   Collection Time: 09/03/19  10:00 AM   Specimen: BLOOD  Result Value Ref Range Status   Specimen Description BLOOD LEFT ANTECUBITAL  Final   Special Requests   Final    BOTTLES DRAWN AEROBIC AND ANAEROBIC Blood Culture adequate volume   Culture  Setup Time   Final    GRAM POSITIVE COCCI IN BOTH AEROBIC AND ANAEROBIC BOTTLES CRITICAL VALUE NOTED.  VALUE IS CONSISTENT WITH PREVIOUSLY REPORTED AND CALLED VALUE.    Culture   Final    CULTURE REINCUBATED FOR BETTER GROWTH Performed at [facility], [place] [address] [place], [place] [address]    Report Status PENDING  Incomplete  Culture, blood (Routine X 2) w Reflex to ID Panel     Status: None (Preliminary result)   Collection Time: 09/03/19 10:45 AM   Specimen: BLOOD LEFT ARM  Result Value Ref Range Status   Specimen Description BLOOD LEFT ARM  Final   Special Requests   Final    BOTTLES DRAWN AEROBIC AND ANAEROBIC Blood Culture adequate volume   Culture  Setup Time   Final    IN BOTH AEROBIC AND ANAEROBIC BOTTLES GRAM POSITIVE COCCI CRITICAL VALUE NOTED.  VALUE IS CONSISTENT WITH PREVIOUSLY REPORTED AND CALLED VALUE.    Culture   Final    CULTURE REINCUBATED FOR BETTER GROWTH Performed at [facility], [place] [address], [place], [place] [address]    Report Status PENDING  Incomplete    Lab Basic Metabolic Panel: Recent Labs  Lab 08/31/19 0527  09/01/19 0306 09/02/19 0259 09/03/19 0317  09-21-19 0313 September 21, 2019 0508 September 21, 2019 1458 Sep 21, 2019 1521 2019/09/21 1643  NA 137  --  143 143 148*   < > 147* 148* 149* 149* 151*  K 5.2*   < > 4.5 5.6* 5.3*   < > 6.0* 5.9* 6.4* 6.5* 6.8*  CL 109  --  115* 116* 119*  --  120*  --   --  118*  --   CO2 17*  --  19* 18* 20*  --  20*  --   --  22  --   GLUCOSE 155*  --  210* 220* 190*  --  208*  --   --  313*  --   BUN 37*  --  36* 39* 50*  --  94*  --   --  110*  --   CREATININE 1.60*  --  1.14 0.98 0.99  --  1.70*  --   --  2.24*  --   CALCIUM 8.2*  --  8.6* 8.3* 8.4*  --  8.1*  --   --  9.0  --   MG 1.7   --  2.2 2.1 2.1  --  2.5*  --   --  2.5*  --   PHOS 4.7*  --   --  3.3  --   --   --   --   --  9.6*  --    < > = values in this interval not displayed.   Liver Function Tests: Recent Labs  Lab 09/01/19 0306 09/02/19 0259 09/03/19 0317 09-21-19 0313 09/21/19 1521  AST 42* 61* 58* 61* 52*  ALT 30 33 39 36 34  ALKPHOS 89 105 123 124 133*  BILITOT 3.5* 3.8* 3.0* 3.3* 2.0*  PROT 5.7* 5.4* 5.9* 6.4* 5.6*  ALBUMIN 1.9* 1.6* 1.4* 1.3* 1.1*   Recent Labs  Lab 08/14/2019 1838  LIPASE 173*   No results for input(s): AMMONIA in the last 168 hours. CBC: Recent Labs  Lab 08/22/2019 1838  08/31/19 0527 09/01/19 0306 09/02/19 0501 09/03/19 0317 09/03/19 0806 2019-09-21 0313 09/21/2019 0508 21-Sep-2019 1458 Sep 21, 2019 1643  WBC 23.8*   < > 22.4* 19.6* 22.1* 18.0*  --  23.0*  --   --   --   NEUTROABS 20.2*  --   --   --   --   --   --   --   --   --   --   HGB 15.1   < > 12.0* 11.5* 12.1* 11.9* 14.3 11.7* 10.9* 10.9* 12.6*  HCT 43.8   < > 35.9* 33.1* 37.0* 35.5* 42.0 37.1* 32.0* 32.0* 37.0*  MCV 90.5   < > 93.0 93.0 94.4 93.7  --  97.4  --   --   --   PLT 281   < >  248 216 194 198  --  213  --   --   --    < > = values in this interval not displayed.   Cardiac Enzymes: No results for input(s): CKTOTAL, CKMB, CKMBINDEX, TROPONINI in the last 168 hours. Sepsis Labs: Recent Labs  Lab 08/30/19 0249  08/31/19 0527 08/31/19 1150 09/01/19 0306 09/02/19 0501 09/03/19 0317 09/17/19 0313 September 17, 2019 1113 2019/09/17 1430  PROCALCITON 7.65  --   --   --   --   --   --   --  8.74  --   WBC 22.4*  --  22.4*  --  19.6* 22.1* 18.0* 23.0*  --   --   LATICACIDVEN 3.2*   < > 2.2* 2.0*  --   --   --   --  2.2* 4.7*   < > = values in this interval not displayed.    Procedures/Operations  10/25: ett 10/26 B/l CT 10/27: b/l CT 10/27: arterial line, cvc,vas cath   Audria Nine 09/01/2019, 7:57 AM

## 2019-09-09 NOTE — Progress Notes (Signed)
Attempted lower extremity venous duplex, however a code blue has been called for the patient.  09-26-2019 3:01 PM Maudry Mayhew, MHA, RVT, RDCS, RDMS

## 2019-09-09 NOTE — Procedures (Signed)
Chest Tube Insertion Procedure Note  Indications:  Clinically significant Pneumothorax  Pre-operative Diagnosis: Pneumothorax  Post-operative Diagnosis: Pneumothorax  Procedure Details  Informed consent was obtained for the procedure, including sedation.  Risks of lung perforation, hemorrhage, arrhythmia, and adverse drug reaction were discussed.   After sterile skin prep, using standard technique, a 20 French tube was placed in the left lateral 4 rib space.  Findings: None  Estimated Blood Loss:  Minimal         Specimens:  None              Complications:  None; patient tolerated the procedure well.         Disposition: ICU - intubated and critically ill.         Condition: stable  Attending Attestation: I performed the procedure.

## 2019-09-09 NOTE — Progress Notes (Signed)
[facility] Progress Note Patient Name: Scott Strong DOB: Aug 12, 1971 MRN: [id number]   Date of Service  09/27/2019  HPI/Events of Note  Notified of ABG 6.97/108/39 on 28/540/100%/8 PEEP. Peak pressures 37.  Bilateral chest tubes for pneumothorax  eICU Interventions   Increased rate to 32 and decrease IT to 0.7 to maintain I:E 1:2  Dialysis started     Intervention Category Major Interventions: Acid-Base disturbance - evaluation and management  Judd Lien 09/27/2019, 9:45 PM

## 2019-09-09 NOTE — Procedures (Signed)
Chest Tube Insertion Procedure Note  Indications:  Clinically significant Pneumothorax  Pre-operative Diagnosis: Pneumothorax  Post-operative Diagnosis: Pneumothorax  Procedure Details  Informed consent was obtained for the procedure, including sedation.  Risks of lung perforation, hemorrhage, arrhythmia, and adverse drug reaction were discussed.   After sterile skin prep, using standard technique, a 20 French tube was placed in the right lateral 4 rib space.  Findings: None  Estimated Blood Loss:  Minimal         Specimens:  None              Complications:  None; patient tolerated the procedure well.         Disposition: ICU - intubated and hemodynamically stable.         Condition: stable  Attending Attestation: I performed the procedure.

## 2019-09-09 NOTE — Progress Notes (Signed)
Updated pt's significant other and sister, jocelyn via phone

## 2019-09-09 NOTE — Progress Notes (Signed)
[facility]  Date of Admission:  08/20/2019      Total days of antibiotics 7  Vancomycin Day 7    ASSESSMENT: Scott Strong is a 48 y.o. 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 Now complicated by respiratory failure, bilateral pneumothoraces and possible pneumopericardium. S/P chest tube placement however still having trouble with hypoxia and incomplete resolution.   Source unclear at this time but suspicious for right sided endocarditis.  Difficult to determine other foci of infections at this time given intubated state; initial presentation for chest pain/SOB. Continue vancomycin for bacteremia with low threshold to switch if his AKI worsens. He had large volume removed following CT placement (700cc) and increased urine output. Would advocate for ongoing gentle hydration and follow creatinine tomorrow.  Repeated cultures were positive 24h after abx started. Will repeat today to gauge clearance.  Please hold on central line access if possible until he clears. Once he stabilizes would image spine with MRI if possible and check TEE.   Respiratory failure - remains hypoxic on high vent settings. PCCM managing. Bilateral ptx's s/p chest tubes; likely complication from septic emboli.  Respiratory viral panel + for rhinovirus.   Pericarditis - viral vs bacterial? EF 10/22 with normal EF 55-60%, no large vegetations seen. Small concentric pericardial effusion. No signs of tamponade on exam today. Cardiology following and giving colchicine. TEE at some point when clinically stable.   Medication monitoring on Vancomycin - Cr bump from 0.99 to 1.7 o/n. As above would hydrate to replace losses and follow tomorrow with low threshold to change agent to more kidney friendly option.   Injection drug use w/in 49mo- HIV Ab nonreactive. Will check Hep B sAg and Hep C Ab in AM for routine health screening.     PLAN: 1. Follow  blood cultures  2. Continue vancomycin IV for now 3. Creatinine in AM 10/28 4. Hold on PICC/central line for now 5. Spine imaging and TEE when stabilized to do so     Principal Problem:   MRSA bacteremia Active Problems:   Acute infective pericarditis   Septic pulmonary embolism ([facility]   Suspected COVID-19 virus infection   Severe sepsis (HCC)   Respiratory distress   Hemoptysis   Respiratory failure, acute ([facility]   Chest tube in place   Injection of illicit drug within last 12 months   Heroin use disorder, severe, dependence ([facility]   Pressure injury of skin   Pneumothorax   Paroxysmal atrial fibrillation ([facility]   . chlorhexidine gluconate (MEDLINE KIT)  15 mL Mouth Rinse BID  . Chlorhexidine Gluconate Cloth  6 each Topical Q0600  . [START ON 08/25/2019] Chlorhexidine Gluconate Cloth  6 each Topical Q0600  . clonazePAM  2 mg Per Tube Q12H  . colchicine  0.6 mg Oral BID  . docusate  200 mg Per Tube BID  . free water  200 mL Per Tube Q6H  . heparin injection (subcutaneous)  5,000 Units Subcutaneous Q8H  . insulin aspart  0-20 Units Subcutaneous Q4H  . ipratropium-albuterol  3 mL Nebulization QID  . mouth rinse  15 mL Mouth Rinse 10 times per day  . methylPREDNISolone (SOLU-MEDROL) injection  40 mg Intravenous Q12H  . oxyCODONE  10 mg Per Tube Q6H  . pantoprazole sodium  40 mg Per Tube Daily  . QUEtiapine  50 mg Per Tube Q12H  . sennosides  5 mL Per Tube BID  SUBJECTIVE: Remains intubated.   Interval Additions -  Difficulty with incomplete resolution of and now b/l ptx's. Afebrile overnight; WBC 23K today.  Not following commands and heavily sedated.  Repeat BCx on 10/23 growing MRSA again. Repeat BCx on 10/26 without growth prelim  Resp Cx growing MRSA (R-bactrim, cipro)  Review of Systems: Review of Systems  Unable to perform ROS: Intubated    No Known Allergies  OBJECTIVE: Vitals:   Sep 11, 2019 1115 09/11/2019 1130 2019-09-11 1145 Sep 11, 2019 1200  BP:  (!) 90/53  98/71 91/69  Pulse: (!) 154 (!) 176 (!) 153 (!) 154  Resp: (!) 39 (!) 38 (!) 38 (!) 38  Temp:      TempSrc:      SpO2: (!) 88% (!) 88% (!) 89% (!) 89%  Weight:      Height:       Body mass index is 31.54 kg/m.  Physical Exam Vitals signs and nursing note reviewed.  Constitutional:      Comments: Sedated on ventilator. Breathing over settings.   Cardiovascular:     Rate and Rhythm: Tachycardia present.     Heart sounds: Heart sounds are distant.     Comments: Ongoing ST elevation on tele Pulmonary:     Effort: Tachypnea and accessory muscle usage present.     Breath sounds: No decreased breath sounds or wheezing. Rhonchi: coarse upper anterior breath sounds.  Abdominal:     General: Bowel sounds are normal.  Skin:    General: Skin is warm and dry.     Capillary Refill: Capillary refill takes less than 2 seconds.     Lab Results Lab Results  Component Value Date   WBC 23.0 (H) 2019-09-11   HGB 10.9 (L) 11-Sep-2019   HCT 32.0 (L) Sep 11, 2019   MCV 97.4 09-11-2019   PLT 213 09/11/19    Lab Results  Component Value Date   CREATININE 1.70 (H) 09-11-2019   BUN 94 (H) 11-Sep-2019   NA 148 (H) 09-11-2019   K 5.9 (H) 2019/09/11   CL 120 (H) 11-Sep-2019   CO2 20 (L) 11-Sep-2019    Lab Results  Component Value Date   ALT 36 09/11/2019   AST 61 (H) 09-11-19   ALKPHOS 124 September 11, 2019   BILITOT 3.3 (H) 09/11/2019     Microbiology: Recent Results (from the past 240 hour(s))  Culture, blood (Routine x 2)     Status: Abnormal   Collection Time: 08/19/2019  6:30 PM   Specimen: BLOOD  Result Value Ref Range Status   Specimen Description BLOOD RIGHT ANTECUBITAL  Final   Special Requests   Final    BOTTLES DRAWN AEROBIC AND ANAEROBIC Blood Culture adequate volume   Culture  Setup Time   Final    IN BOTH AEROBIC AND ANAEROBIC BOTTLES GRAM POSITIVE COCCI CRITICAL RESULT CALLED TO, READ BACK BY AND VERIFIED WITH: G ABBOTT PHARMD 08/30/19 0717 JDW    Culture (A)  Final     STAPHYLOCOCCUS AUREUS SUSCEPTIBILITIES PERFORMED ON PREVIOUS CULTURE WITHIN THE LAST 5 DAYS. Performed at [facility], [place] [address] [place], [place] [address]    Report Status 09/01/2019 FINAL  Final  Culture, blood (Routine x 2)     Status: Abnormal   Collection Time: 09/07/2019  6:56 PM   Specimen: BLOOD RIGHT HAND  Result Value Ref Range Status   Specimen Description BLOOD RIGHT HAND  Final   Special Requests   Final    BOTTLES DRAWN AEROBIC ONLY Blood Culture results  may not be optimal due to an inadequate volume of blood received in culture bottles Performed at [facility], [place] [address] [place], [place] [address]    Culture  Setup Time   Final    GRAM POSITIVE COCCI IN CLUSTERS AEROBIC BOTTLE ONLY CRITICAL RESULT CALLED TO, READ BACK BY AND VERIFIED WITH: Leonie Green PHARMD, AT [id number] 08/30/19    Culture METHICILLIN RESISTANT STAPHYLOCOCCUS AUREUS (A)  Final   Report Status 09/01/2019 FINAL  Final   Organism ID, Bacteria METHICILLIN RESISTANT STAPHYLOCOCCUS AUREUS  Final      Susceptibility   Methicillin resistant staphylococcus aureus - MIC*    CIPROFLOXACIN >=8 RESISTANT Resistant     ERYTHROMYCIN >=8 RESISTANT Resistant     GENTAMICIN <=0.5 SENSITIVE Sensitive     OXACILLIN >=4 RESISTANT Resistant     TETRACYCLINE <=1 SENSITIVE Sensitive     VANCOMYCIN <=0.5 SENSITIVE Sensitive     TRIMETH/SULFA >=320 RESISTANT Resistant     CLINDAMYCIN <=0.25 SENSITIVE Sensitive     RIFAMPIN <=0.5 SENSITIVE Sensitive     Inducible Clindamycin NEGATIVE Sensitive     * METHICILLIN RESISTANT STAPHYLOCOCCUS AUREUS  Blood Culture ID Panel (Reflexed)     Status: Abnormal   Collection Time: 08/24/2019  6:56 PM  Result Value Ref Range Status   Enterococcus species NOT DETECTED NOT DETECTED Final   Listeria monocytogenes NOT DETECTED NOT DETECTED Final   Staphylococcus species DETECTED (A) NOT DETECTED Final    Comment: CRITICAL RESULT CALLED TO, READ BACK BY AND VERIFIED WITH:  Leonie Green PharmD 10:30 08/29/29 ([email or web address])    Staphylococcus aureus (BCID) DETECTED (A) NOT DETECTED Final    Comment: Methicillin (oxacillin)-resistant Staphylococcus aureus (MRSA). MRSA is predictably resistant to beta-lactam antibiotics (except ceftaroline). Preferred therapy is vancomycin unless clinically contraindicated. Patient requires contact precautions if  hospitalized. CRITICAL RESULT CALLED TO, READ BACK BY AND VERIFIED WITH: Leonie Green PharmD 10:30 08/30/19 ([email or web address])    Methicillin resistance DETECTED (A) NOT DETECTED Final    Comment: CRITICAL RESULT CALLED TO, READ BACK BY AND VERIFIED WITH: Leonie Green PharmD 10:30 08/30/19 (wilsonm)    Streptococcus species NOT DETECTED NOT DETECTED Final   Streptococcus agalactiae NOT DETECTED NOT DETECTED Final   Streptococcus pneumoniae NOT DETECTED NOT DETECTED Final   Streptococcus pyogenes NOT DETECTED NOT DETECTED Final   Acinetobacter baumannii NOT DETECTED NOT DETECTED Final   Enterobacteriaceae species NOT DETECTED NOT DETECTED Final   Enterobacter cloacae complex NOT DETECTED NOT DETECTED Final   Escherichia coli NOT DETECTED NOT DETECTED Final   Klebsiella oxytoca NOT DETECTED NOT DETECTED Final   Klebsiella pneumoniae NOT DETECTED NOT DETECTED Final   Proteus species NOT DETECTED NOT DETECTED Final   Serratia marcescens NOT DETECTED NOT DETECTED Final   Haemophilus influenzae NOT DETECTED NOT DETECTED Final   Neisseria meningitidis NOT DETECTED NOT DETECTED Final   Pseudomonas aeruginosa NOT DETECTED NOT DETECTED Final   Candida albicans NOT DETECTED NOT DETECTED Final   Candida glabrata NOT DETECTED NOT DETECTED Final   Candida krusei NOT DETECTED NOT DETECTED Final   Candida parapsilosis NOT DETECTED NOT DETECTED Final   Candida tropicalis NOT DETECTED NOT DETECTED Final    Comment: Performed at [facility], [place] [address], [place], [place] [address]  SARS CORONAVIRUS 2 (TAT 6-24 HRS) Nasopharyngeal Nasopharyngeal  Swab     Status: None   Collection Time: 09/08/2019 10:11 PM   Specimen: Nasopharyngeal Swab  Result Value Ref Range Status   SARS Coronavirus 2 NEGATIVE  NEGATIVE Final    Comment: (NOTE) SARS-CoV-2 target nucleic acids are NOT DETECTED. The SARS-CoV-2 RNA is generally detectable in upper and lower respiratory specimens during the acute phase of infection. Negative results do not preclude SARS-CoV-2 infection, do not rule out co-infections with other pathogens, and should not be used as the sole basis for treatment or other patient management decisions. Negative results must be combined with clinical observations, patient history, and epidemiological information. The expected result is Negative. Fact Sheet for Patients: SugarRoll.be Fact Sheet for Healthcare Providers: https://www.woods-mathews.com/ This test is not yet approved or cleared by the [place] FDA and  has been authorized for detection and/or diagnosis of SARS-CoV-2 by FDA under an Emergency Use Authorization (EUA). This EUA will remain  in effect (meaning this test can be used) for the duration of the COVID-19 declaration under Section 56 4(b)(1) of the Act, 21 U.S.C. section 360bbb-3(b)(1), unless the authorization is terminated or revoked sooner. Performed at [facility], [place] [address], [place], [place] [address]   Urine culture     Status: Abnormal   Collection Time: 08/22/2019 10:15 PM   Specimen: In/Out Cath Urine  Result Value Ref Range Status   Specimen Description IN/OUT CATH URINE  Final   Special Requests   Final    NONE Performed at [facility], [place] [address] [place], [place] [address]    Culture (A)  Final    3,000 COLONIES/mL METHICILLIN RESISTANT STAPHYLOCOCCUS AUREUS   Report Status 09/01/2019 FINAL  Final   Organism ID, Bacteria METHICILLIN RESISTANT STAPHYLOCOCCUS AUREUS (A)  Final      Susceptibility   Methicillin resistant  staphylococcus aureus - MIC*    CIPROFLOXACIN >=8 RESISTANT Resistant     GENTAMICIN <=0.5 SENSITIVE Sensitive     NITROFURANTOIN <=16 SENSITIVE Sensitive     OXACILLIN >=4 RESISTANT Resistant     TETRACYCLINE <=1 SENSITIVE Sensitive     VANCOMYCIN 1 SENSITIVE Sensitive     TRIMETH/SULFA >=320 RESISTANT Resistant     CLINDAMYCIN <=0.25 SENSITIVE Sensitive     RIFAMPIN <=0.5 SENSITIVE Sensitive     Inducible Clindamycin NEGATIVE Sensitive     * 3,000 COLONIES/mL METHICILLIN RESISTANT STAPHYLOCOCCUS AUREUS  SARS CORONAVIRUS 2 (TAT 6-24 HRS) Nasopharyngeal Nasopharyngeal Swab     Status: None   Collection Time: 08/30/19  5:38 AM   Specimen: Nasopharyngeal Swab  Result Value Ref Range Status   SARS Coronavirus 2 NEGATIVE NEGATIVE Final    Comment: (NOTE) SARS-CoV-2 target nucleic acids are NOT DETECTED. The SARS-CoV-2 RNA is generally detectable in upper and lower respiratory specimens during the acute phase of infection. Negative results do not preclude SARS-CoV-2 infection, do not rule out co-infections with other pathogens, and should not be used as the sole basis for treatment or other patient management decisions. Negative results must be combined with clinical observations, patient history, and epidemiological information. The expected result is Negative. Fact Sheet for Patients: SugarRoll.be Fact Sheet for Healthcare Providers: https://www.woods-mathews.com/ This test is not yet approved or cleared by the [place] FDA and  has been authorized for detection and/or diagnosis of SARS-CoV-2 by FDA under an Emergency Use Authorization (EUA). This EUA will remain  in effect (meaning this test can be used) for the duration of the COVID-19 declaration under Section 56 4(b)(1) of the Act, 21 U.S.C. section 360bbb-3(b)(1), unless the authorization is terminated or revoked sooner. Performed at [facility], [place] [address]  [place], [place] [address]   Culture,  respiratory     Status: None   Collection Time: 08/30/19  5:39 AM   Specimen: SPU  Result Value Ref Range Status   Specimen Description SPUTUM  Final   Special Requests NONE  Final   Gram Stain   Final    NO WBC SEEN NO ORGANISMS SEEN Performed at [facility], [address], [place], [place] [address]    Culture FEW METHICILLIN RESISTANT STAPHYLOCOCCUS AUREUS  Final   Report Status 09/01/2019 FINAL  Final   Organism ID, Bacteria METHICILLIN RESISTANT STAPHYLOCOCCUS AUREUS  Final      Susceptibility   Methicillin resistant staphylococcus aureus - MIC*    CIPROFLOXACIN >=8 RESISTANT Resistant     ERYTHROMYCIN >=8 RESISTANT Resistant     GENTAMICIN <=0.5 SENSITIVE Sensitive     OXACILLIN >=4 RESISTANT Resistant     TETRACYCLINE <=1 SENSITIVE Sensitive     VANCOMYCIN 1 SENSITIVE Sensitive     TRIMETH/SULFA >=320 RESISTANT Resistant     CLINDAMYCIN <=0.25 SENSITIVE Sensitive     RIFAMPIN <=0.5 SENSITIVE Sensitive     Inducible Clindamycin NEGATIVE Sensitive     * FEW METHICILLIN RESISTANT STAPHYLOCOCCUS AUREUS  Respiratory Panel by PCR     Status: Abnormal   Collection Time: 08/30/19  7:27 AM   Specimen: Nasopharyngeal Swab; Respiratory  Result Value Ref Range Status   Adenovirus NOT DETECTED NOT DETECTED Final   Coronavirus 229E NOT DETECTED NOT DETECTED Final    Comment: (NOTE) The Coronavirus on the Respiratory Panel, DOES NOT test for the novel  Coronavirus (2019 nCoV)    Coronavirus HKU1 NOT DETECTED NOT DETECTED Final   Coronavirus NL63 NOT DETECTED NOT DETECTED Final   Coronavirus OC43 NOT DETECTED NOT DETECTED Final   Metapneumovirus NOT DETECTED NOT DETECTED Final   Rhinovirus / Enterovirus DETECTED (A) NOT DETECTED Final   Influenza A NOT DETECTED NOT DETECTED Final   Influenza B NOT DETECTED NOT DETECTED Final   Parainfluenza Virus 1 NOT DETECTED NOT DETECTED Final   Parainfluenza Virus 2 NOT DETECTED NOT DETECTED Final    Parainfluenza Virus 3 NOT DETECTED NOT DETECTED Final   Parainfluenza Virus 4 NOT DETECTED NOT DETECTED Final   Respiratory Syncytial Virus NOT DETECTED NOT DETECTED Final   Bordetella pertussis NOT DETECTED NOT DETECTED Final   Chlamydophila pneumoniae NOT DETECTED NOT DETECTED Final   Mycoplasma pneumoniae NOT DETECTED NOT DETECTED Final    Comment: Performed at [facility], [place] [address] [place], [place] [address]  MRSA PCR Screening     Status: None   Collection Time: 08/30/19  3:02 PM   Specimen: Nasopharyngeal  Result Value Ref Range Status   MRSA by PCR NEGATIVE NEGATIVE Final    Comment:        The GeneXpert MRSA Assay (FDA approved for NASAL specimens only), is one component of a comprehensive MRSA colonization surveillance program. It is not intended to diagnose MRSA infection nor to guide or monitor treatment for MRSA infections. Performed at [facility], [place] [address], [place], [place] [address]   Culture, blood (routine x 2)     Status: Abnormal (Preliminary result)   Collection Time: 08/31/19  5:35 AM   Specimen: BLOOD RIGHT HAND  Result Value Ref Range Status   Specimen Description BLOOD RIGHT HAND  Final   Special Requests   Final    BOTTLES DRAWN AEROBIC ONLY Blood Culture results may not be optimal due to an inadequate volume of blood received in  culture bottles   Culture  Setup Time   Final    GRAM POSITIVE COCCI AEROBIC BOTTLE ONLY CRITICAL VALUE NOTED.  VALUE IS CONSISTENT WITH PREVIOUSLY REPORTED AND CALLED VALUE.    Culture (A)  Final    STAPHYLOCOCCUS AUREUS SUSCEPTIBILITIES PERFORMED ON PREVIOUS CULTURE WITHIN THE LAST 5 DAYS. Performed at [facility], [place] [address] [place], [place] [address]    Report Status PENDING  Incomplete  Culture, blood (routine x 2)     Status: Abnormal   Collection Time: 08/31/19  5:40 AM   Specimen: BLOOD LEFT HAND  Result Value Ref Range Status   Specimen Description BLOOD LEFT HAND  Final    Special Requests   Final    BOTTLES DRAWN AEROBIC ONLY Blood Culture adequate volume   Culture  Setup Time   Final    GRAM POSITIVE COCCI AEROBIC BOTTLE ONLY CRITICAL VALUE NOTED.  VALUE IS CONSISTENT WITH PREVIOUSLY REPORTED AND CALLED VALUE.    Culture (A)  Final    STAPHYLOCOCCUS AUREUS SUSCEPTIBILITIES PERFORMED ON PREVIOUS CULTURE WITHIN THE LAST 5 DAYS. Performed at [facility], [place] [address] [place], [place] [address]    Report Status 09/01/2019 FINAL  Final  Culture, blood (Routine X 2) w Reflex to ID Panel     Status: None (Preliminary result)   Collection Time: 09/03/19 10:00 AM   Specimen: BLOOD  Result Value Ref Range Status   Specimen Description BLOOD LEFT ANTECUBITAL  Final   Special Requests   Final    BOTTLES DRAWN AEROBIC AND ANAEROBIC Blood Culture adequate volume   Culture  Setup Time   Final    GRAM POSITIVE COCCI IN BOTH AEROBIC AND ANAEROBIC BOTTLES CRITICAL VALUE NOTED.  VALUE IS CONSISTENT WITH PREVIOUSLY REPORTED AND CALLED VALUE.    Culture   Final    CULTURE REINCUBATED FOR BETTER GROWTH Performed at [facility], [place] [address], [place], [place] [address]    Report Status PENDING  Incomplete  Culture, blood (Routine X 2) w Reflex to ID Panel     Status: None (Preliminary result)   Collection Time: 09/03/19 10:45 AM   Specimen: BLOOD LEFT ARM  Result Value Ref Range Status   Specimen Description BLOOD LEFT ARM  Final   Special Requests   Final    BOTTLES DRAWN AEROBIC AND ANAEROBIC Blood Culture adequate volume   Culture  Setup Time   Final    IN BOTH AEROBIC AND ANAEROBIC BOTTLES GRAM POSITIVE COCCI CRITICAL VALUE NOTED.  VALUE IS CONSISTENT WITH PREVIOUSLY REPORTED AND CALLED VALUE.    Culture   Final    CULTURE REINCUBATED FOR BETTER GROWTH Performed at [facility], [place] [address], [place], [place] [address]    Report Status PENDING  Incomplete     Janene Madeira, MSN, NP-C [facility]  [email or web address] Pager: [phone] Office: [phone] [place]: [phone]

## 2019-09-09 NOTE — Procedures (Addendum)
Central Venous Catheter Insertion Procedure Note Scott Strong [id number] 1971/04/17  Procedure: Insertion of Central Venous Catheter Indications: Assessment of intravascular volume, Drug and/or fluid administration and Frequent blood sampling  Procedure Details Consent: Risks of procedure as well as the alternatives and risks of each were explained to the (patient/caregiver).  Consent for procedure obtained. Time Out: Verified patient identification, verified procedure, site/side was marked, verified correct patient position, special equipment/implants available, medications/allergies/relevent history reviewed, required imaging and test results available.  Performed  Maximum sterile technique was used including antiseptics, cap, gloves, gown, hand hygiene, mask and sheet. Skin prep: Chlorhexidine; local anesthetic administered A antimicrobial bonded/coated triple lumen catheter was placed in the left internal jugular vein using the Seldinger technique.  Evaluation Blood flow good Complications: No apparent complications Patient did tolerate procedure well. Chest X-ray ordered to verify placement.  CXR: pending.  Johnsie Cancel, NP-C [facility] After hours pager: [phone]. 08/19/2019, 3:17 PM

## 2019-09-09 NOTE — Procedures (Addendum)
Arterial Catheter Insertion Procedure Note Josua Ferrebee [id number] 1970-11-11  Procedure: Insertion of Arterial Catheter  Indications: Blood pressure monitoring and Frequent blood sampling  Procedure Details Consent: Risks of procedure as well as the alternatives and risks of each were explained to the (patient/caregiver).  Consent for procedure obtained. Time Out: Verified patient identification, verified procedure, site/side was marked, verified correct patient position, special equipment/implants available, medications/allergies/relevent history reviewed, required imaging and test results available.  Performed  Maximum sterile technique was used including antiseptics, cap, gloves, gown, hand hygiene, mask and sheet. Skin prep: Chlorhexidine; local anesthetic administered 20 gauge catheter was inserted into right radial artery using the Seldinger technique. ULTRASOUND GUIDANCE USED: NO Evaluation Blood flow good; BP tracing good. Complications: No apparent complications.  Johnsie Cancel, NP-C [facility] After hours pager: [phone]. 2019/09/22, 3:19 PM

## 2019-09-09 NOTE — Progress Notes (Signed)
Lengthy update with pt's mother again via interpreter. Discussed that pt is hyperkalemic and on 3 pressors and would not likely tolerate crrt for correction of hyperkalemia.   I have consulted nephrology to eval as well. However, unsure if there will be any good solution.   I have d/w them that I believe this pt is actively dying and that it is a matter of when his heart will stop. I expressed that his oxygen is life threateningly low and that his saturations are unable to be increased as he remains at 100% fio2 escalated peep with b/l chest tubes x4 for ptx.   Pt has progressive renal failure, shock, tachycardia, and hypoxemic resp failure.   His prognosis is grim.  His mother expressed understanding and is deferring many decisions to the medical team.   Pt remains full code based on discussions with family.

## 2019-09-09 NOTE — Progress Notes (Signed)
Responded to code blue in progress . Patient critical and per staff patient doesn't appear that he will survive.  Family was call and spoken to by [facility]. And is in route back to hospital. Staff still working with patient Chaplain available as needed.     Jaclynn Major, [place], [facility], Pager [phone]

## 2019-09-09 NOTE — Procedures (Signed)
Hemodialysis Catheter Insertion Procedure Note Scott Strong [id number] 1971/01/27  Procedure: Insertion of Hemodialysis Catheter Indications: Dialysis Access   Procedure Details Consent: Risks of procedure as well as the alternatives and risks of each were explained to the (patient/caregiver).  Consent for procedure obtained. Time Out: Verified patient identification, verified procedure, site/side was marked, verified correct patient position, special equipment/implants available, medications/allergies/relevent history reviewed, required imaging and test results available.  Performed  Maximum sterile technique was used including antiseptics, cap, gloves, gown, hand hygiene, mask and sheet. Skin prep: Chlorhexidine; local anesthetic administered Triple lumen hemodialysis catheter was inserted into left internal jugular vein using the Seldinger technique.  Evaluation Blood flow good Complications: No apparent complications Patient did tolerate procedure with some difficulty with desaturations to the 70's and persistent hypotension. Chest X-ray ordered to verify placement.  CXR: pending.    Scott Strong, AGACNP-BC [place] Pager [phone] or [phone]  08/31/2019 7:04 PM

## 2019-09-09 NOTE — Progress Notes (Signed)
This RN was at the bedside assisting the NP Whitney and Eddie Dibbles place a central line. During this time the patients 02 sats were steadily trending downwards in the mid to low 80s and BP noninvasive dropping to 79-72Q systolic. Patient was in rapid afib on amio drip that was initiated this morning. Once central line was obtained patients HR continues to elevate to 180s with obvious rhythm change which appeared to be V-fib. Charge RN feeling for pulse and patient place on pads. Code Blue was called however no compressions were started at this time. Dr. Ruthann Cancer was still working on obtaining Left chest tube. Patient was given bicarb and calcium and started on Vaso, Levo and Neo. Radial A-line was obtained shortly after. Notable crepitus of the left chest. 10 mg of vec was ordered and pushed at this time.

## 2019-09-09 NOTE — Progress Notes (Addendum)
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  Overnight 10/26 patient seen with increased respiratory distress and increased oxygen requirements prompting CXR that revealed new right pneumothorax requiring placement of pigtail chest tube.   History of present illness   48 year old male patient without significant medical history other than IV drug use.  Presented to the emergency room on 10/21 with chief complaint of about 3-day history of pleuritic type chest discomfort, cough with occasional streaky hemoptysis and progressive shortness of breath.  He also reported intermittent fever and myalgia.  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  In the emergency room he was found to be tachycardic and tachypneic meeting SIRS parameters.  Lactic acid was slightly elevated at 2.8, white blood cell count 50,000, COVID-19 negative.  CT chest was obtained after abnormal chest x-ray evaluated this demonstrated multifocal patchy opacities he was admitted with a working diagnosis of sepsis.  He was administered IV fluids, supplemental oxygen, cultures were obtained and empiric antibiotics started.  Not long after hospital admission, early in the morning on 10/22 Rapid response call was made.  The patient had become progressively hypotensive requiring repeat fluid challenge his temperature spiked to 102.4 and he remained tachycardic.  He had a elevated D-dimer and because of this a CT angiogram was obtained this showed progression of bilateral pleural effusions particularly on the right with the right effusion appearing more complicated in nature, he also had what appeared to be multifocal cavitary areas of  consolidation consistent with septic emboli in addition to small pericardial effusion.  Later that morning his blood cultures were found to be positive with initial study suggesting MRSA.  He continued to decline with worsening work of breathing and because of this critical care was asked to evaluate.  Past Medical History  No significant medical history other than IV drug abuse  Significant Hospital Events   10/21 presented to the emergency room with chief complaint of shortness of breath and chest pain  Consults:  Critical care consulted 10/22 Infectious disease 10/22 Cardiology 10/22  Procedures:  10/22 >> Intubated 10/26 >> Right and left pigtail chest tube   Significant Diagnostic Tests:  CT chest (angiogram) 10/22: Multifocal cavitary pneumonia consistent with septic emboli. No emboli large enough for visualization by CTA. Small pleural effusions that are complex and increased from yesterday, right larger than left. Small pericardial effusion with possible pericarditis. TTE 10/22>>>LVEF 60-65% no overt vegetations RUQ u/s 10/22: Unremarkable right upper quadrant ultrasound. Right pleural effusion similar to that seen on prior exam. CXR 10/26 >> New ~20% right pneumothorax, worsening bilateral pneumonia   CXR 10/27 >> Bilateral small pneumothoraces despite thoracostomy tubes, both slightly increased more on left.   Micro Data:  RVP 10/22 >> rhinovirus/enterovirus covid 19 X 2 (on 10/21 and 10/22) >> both negative Blood culture 10/21 >> +MRSA, resistant to cipro, erythromycin, oxacillin, and Trimeth/sulfa Blood culture 10/23 >> +MRSA Sputum culture 10/22 >> +MRSA  Antimicrobials:  Vanc 10/21>> Cefepime 10/21 >> 10/23  Interim history/subjective:  No significant events overnight, seen with continued increased work of breathing this am and new irregular heart beat, EKG revealed new atrial fibrillation with rapid ventricular response. 1.2L urine output  with 1L output form  bilateral chest tube over the last 24hrs.   Objective   Blood pressure 98/70, pulse (!) 103, temperature 98.9 F (37.2 C), temperature source Axillary, resp. rate (!) 45, height 5\' 8"  (1.727 m), weight 94.1 kg, SpO2 96 %.    Vent Mode: PCV FiO2 (%):  [100 %] 100 % Set Rate:  [22 bmp] 22 bmp PEEP:  [8 cmH20-10 cmH20] 10 cmH20 Plateau Pressure:  [29 cmH20-32 cmH20] 32 cmH20   Intake/Output Summary (Last 24 hours) at 09-12-19 0753 Last data filed at 2019-09-12 0700 Gross per 24 hour  Intake 4500.43 ml  Output 2275 ml  Net 2225.43 ml   Filed Weights   09/02/19 0500 09/03/19 0327 12-Sep-2019 0230  Weight: 91.8 kg 93 kg 94.1 kg    Examination: General: Ill appearing male on mechanical ventilation, in respiratory distress  HEENT: ETT, MM pink/moist, PERRL,  Neuro: sedated on mechanical ventilation  CV: irregularly irregular rate and rhythm, no murmur, rubs, or gallops,  PULM:  Course breath sounds bilaterally with bilateral rhonchi  GI: soft, bowel sounds active in all 4 quadrants, non-tender, non-distended, tolerating TF Extremities: warm/dry, generalized edema  Skin: no rashes or lesions  Assessment and plan    Severe sepsis with septic shock 2/2 MRSA bacteremia w/ 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 Plateau pressures less than 30 cm H20.  Follow intermittent chest x-ray and ABG.   SAT/SBT as tolerated, mentation  preclude extubation  Ensure adequate pulmonary hygiene  Follow cultures  VAP bundle in place  Maintain bilateral chest tube to -40cm suction  Daily CXR  New A-fib RVR -Likely due to demand from increased work of breathing and bacteremia Plan: Amiodarone drip for rate control Unable to anticoagulate at this time given no recent head CT  TSH ordered  Cardiology following  Continuous telemetry    Mild lactic acidosis -improving Plan: Continue treatment for MRSA bacteremia as above  Pericarditis:  -Clinical symptoms, ekg and effusion on echo -Repeat ECHO 10/26 with no evidence of tamponade  Plan: Continue colchicine  Cardiology following  Rhinovirus/enterovirus Plan: Supportive care    Pain w/ h/o IVDA.  -could be component of withdrawal Plan: PAD protocol with Fentanyl and Versed  RASS goal of -4  Constipation:  Continue bowel regiment   Normocytic anemia:  Plan: Follow transfusion protocol Goal Hgb >7  Shock liver with hyperbilirubinemia -Improving -bili lagging -ruq u/s negative Plan: Avoid hepatotoxins Trend liver panel   Acute kidney injury, worsening  Urinary retention -Worsening AKI as of 10/27 with climb in creatinine from 0.99 - 1.70 Plan: Foley catheter now in place, making obstruction physiology less likely  Continue to follow renal fuction / urine output Trend Bmet  Avoid nephrotoxins  Obtain renal lytes   Hyperglycemia hgb a1c 5.1 Plan: SSI ACHS CBG checks Hypoglycemia protocol   Hypernatremia/Hyperchloridemia/Hyperkalemia  -NA 149 slow trend up -K5.9 Plan:  Continue free water flushes IV hydration Repeat Kayexalate today Trend Bmet   Best practice:  Diet: Tube  feed with free water flush  Pain/Anxiety/Delirium protocol (if indicated): Versed and fentanyl drip VAP protocol (if indicated): in place DVT prophylaxis: heparin  GI prophylaxis: PPI Glucose control: SSI Mobility: BR Code Status: Full code  Family  Communication: Extensive update with both fiance and sister on 10/26 and 10/27 given critical illness with poor prognosis  Disposition: ICU  Labs   CBC: Recent Labs  Lab 09/07/2019 1838  08/31/19 0527 09/01/19 0306 09/02/19 0501 09/03/19 0317 09/03/19 0806 2019/09/06 0313 09/06/19 0508  WBC 23.8*   < > 22.4* 19.6* 22.1* 18.0*  --  23.0*  --   NEUTROABS 20.2*  --   --   --   --   --   --   --   --   HGB 15.1   < > 12.0* 11.5* 12.1* 11.9* 14.3 11.7* 10.9*  HCT 43.8   < > 35.9* 33.1* 37.0* 35.5* 42.0 37.1* 32.0*  MCV 90.5   < > 93.0 93.0 94.4 93.7  --  97.4  --   PLT 281   < > 248 216 194 198  --  213  --    < > = values in this interval not displayed.    Basic Metabolic Panel: Recent Labs  Lab 08/31/19 0527  09/01/19 0306 09/02/19 0259 09/03/19 0317 09/03/19 0806 09-06-19 0313 September 06, 2019 0508  NA 137  --  143 143 148* 149* 147* 148*  K 5.2*   < > 4.5 5.6* 5.3* 5.3* 6.0* 5.9*  CL 109  --  115* 116* 119*  --  120*  --   CO2 17*  --  19* 18* 20*  --  20*  --   GLUCOSE 155*  --  210* 220* 190*  --  208*  --   BUN 37*  --  36* 39* 50*  --  94*  --   CREATININE 1.60*  --  1.14 0.98 0.99  --  1.70*  --   CALCIUM 8.2*  --  8.6* 8.3* 8.4*  --  8.1*  --   MG 1.7  --  2.2 2.1 2.1  --  2.5*  --   PHOS 4.7*  --   --  3.3  --   --   --   --    < > = values in this interval not displayed.   GFR: Estimated Creatinine Clearance: 59.2 mL/min (A) (by C-G formula based on SCr of 1.7 mg/dL (H)). Recent Labs  Lab 08/30/19 0249  08/30/19 1000 08/30/19 1505 08/31/19 0527 08/31/19 1150 09/01/19 0306 09/02/19 0501 09/03/19 0317 09-06-19 0313  PROCALCITON 7.65  --   --   --   --   --   --   --   --   --   WBC 22.4*  --   --   --  22.4*  --  19.6* 22.1* 18.0* 23.0*  LATICACIDVEN 3.2*   < > 3.3* 3.7* 2.2* 2.0*  --   --   --   --    < > = values in this interval not displayed.    Liver Function Tests: Recent Labs  Lab 08/31/19 0527 09/01/19 0306 09/02/19 0259 09/03/19 0317 09/06/2019  0313  AST 55* 42* 61* 58* 61*  ALT 37 30 33 39 36  ALKPHOS 79 89 105 123 124  BILITOT 3.9* 3.5* 3.8* 3.0* 3.3*  PROT 6.0* 5.7* 5.4* 5.9* 6.4*  ALBUMIN 2.5* 1.9* 1.6* 1.4* 1.3*   Recent Labs  Lab 08/25/2019 1838  LIPASE  173*   No results for input(s): AMMONIA in the last 168 hours.  ABG    Component Value Date/Time   PHART 7.309 (L) 08/12/2019 0508   PCO2ART 43.0 09/08/2019 0508   PO2ART 79.0 (L) 08/24/2019 0508   HCO3 21.6 09/08/2019 0508   TCO2 23 09/06/2019 0508   ACIDBASEDEF 5.0 (H) 09/02/2019 0508   O2SAT 94.0 09/06/2019 0508     Coagulation Profile: Recent Labs  Lab 08/21/2019 1838  INR 1.4*    Cardiac Enzymes: No results for input(s): CKTOTAL, CKMB, CKMBINDEX, TROPONINI in the last 168 hours.  HbA1C: Hgb A1c MFr Bld  Date/Time Value Ref Range Status  09/01/2019 03:06 AM 5.1 4.8 - 5.6 % Final    Comment:    (NOTE) Pre diabetes:          5.7%-6.4% Diabetes:              >6.4% Glycemic control for   <7.0% adults with diabetes     CBG: Recent Labs  Lab 09/03/19 1502 09/03/19 1915 09/03/19 2301 08/21/2019 0302 08/24/2019 0708  GLUCAP 102* 162* 194* 181* 205*     Critical care time:   CRITICAL CARE Performed by: Delfin GantWhitney F Aaliayah Miao   Total critical care time: 45 minutes  Critical care time was exclusive of separately billable procedures and treating other patients.  Critical care was necessary to treat or prevent imminent or life-threatening deterioration.  Critical care was time spent personally by me on the following activities: development of treatment plan with patient and/or surrogate as well as nursing, discussions with consultants, evaluation of patient's response to treatment, examination of patient, obtaining history from patient or surrogate, ordering and performing treatments and interventions, ordering and review of laboratory studies, ordering and review of radiographic studies, pulse oximetry and re-evaluation of patient's condition.    Signature:    Delfin GantWhitney F Waverly Tarquinio, NP-C [facility] Contact information can be found on [employer]  After hours pager: [phone]. 08/18/2019, 7:53 AM  '

## 2019-09-09 NOTE — Progress Notes (Signed)
Patient mothers name [facility] [id number]

## 2019-09-09 DEATH — deceased

## 2019-09-10 ENCOUNTER — Telehealth: Payer: Self-pay | Admitting: *Deleted

## 2019-09-10 NOTE — Telephone Encounter (Signed)
Received original D/C from [facility] [employer]-D/C forwarded to [place] for signature. (Pulmonary)

## 2019-09-13 NOTE — Telephone Encounter (Signed)
Received original signed D/C, [facility] notified for pick up.  

## 2020-09-22 IMAGING — US US ABDOMEN LIMITED
1 series · 14 of 25 positions shown · non-contrast
Comparison: None.

CLINICAL DATA: Elevated LFTs

EXAM:
ULTRASOUND ABDOMEN LIMITED RIGHT UPPER QUADRANT

[Series 1: us abdomen limited · 14 of 36 slices shown]
[im 1/36]
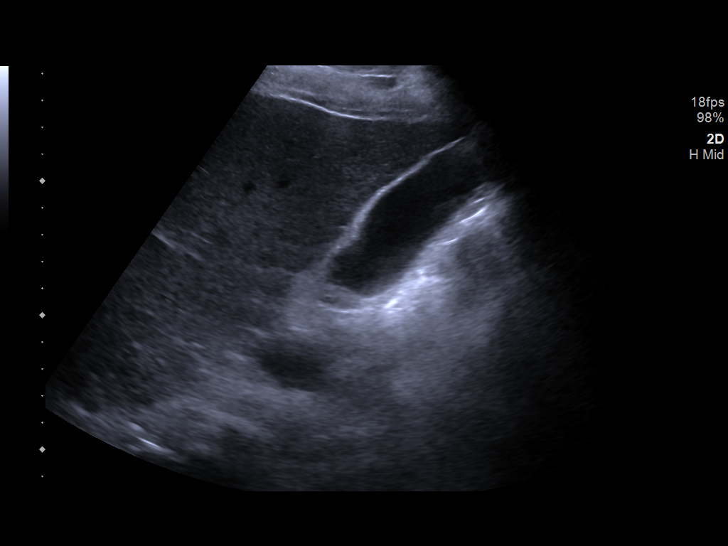
[im 3/36]
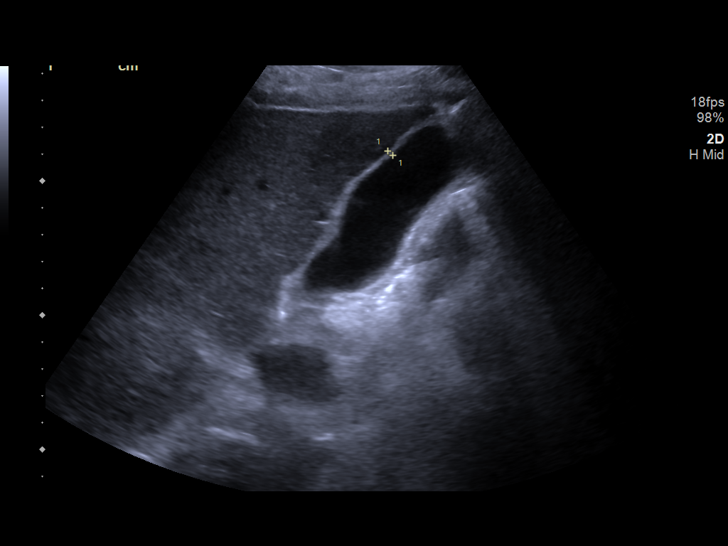
[im 6/36]
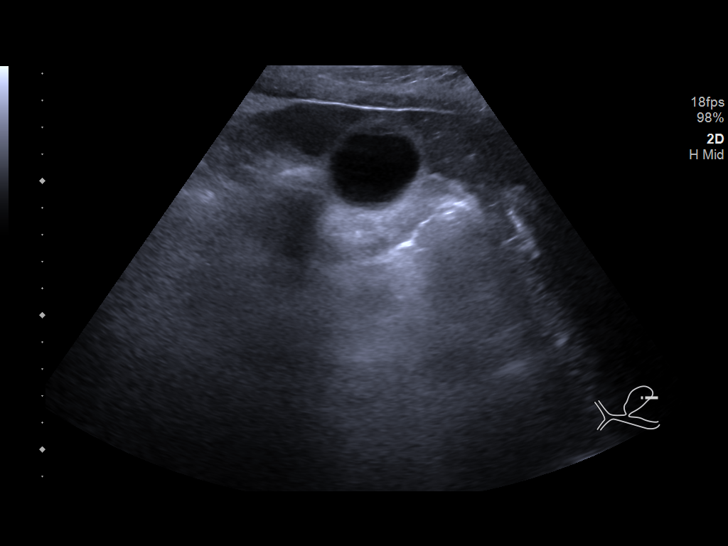
[im 9/36]
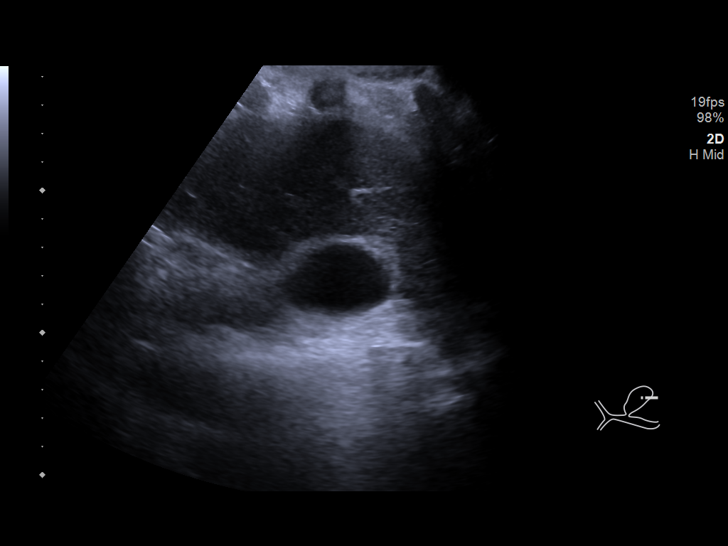
[im 12/36]
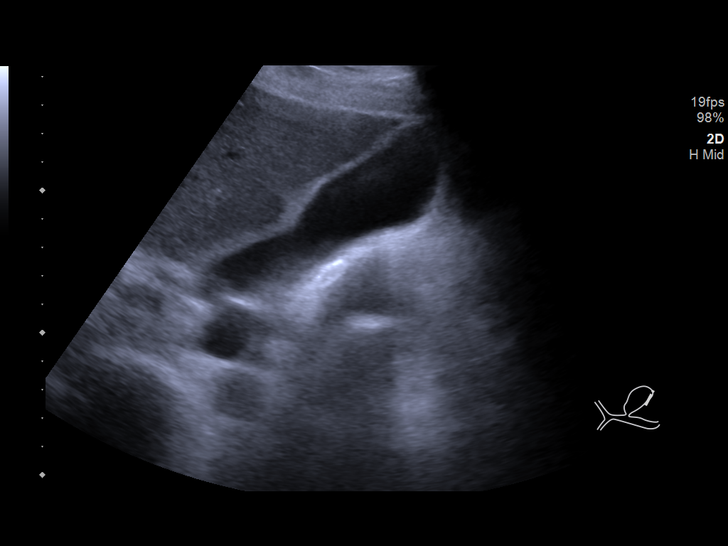
[im 14/36]
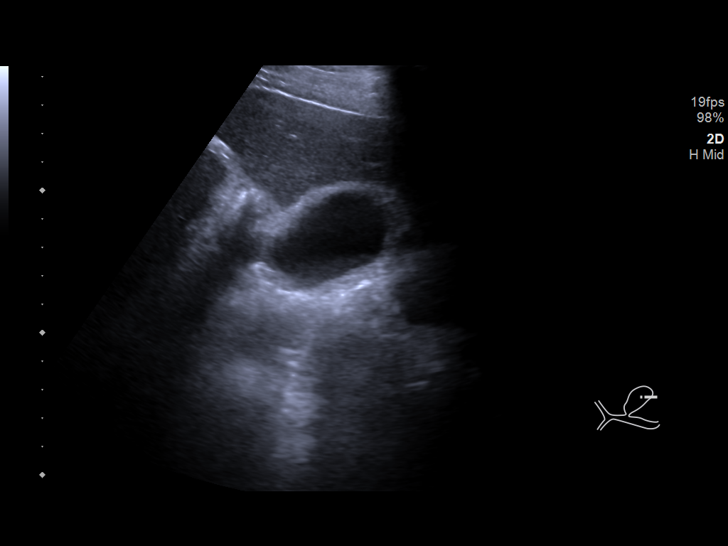
[im 17/36]
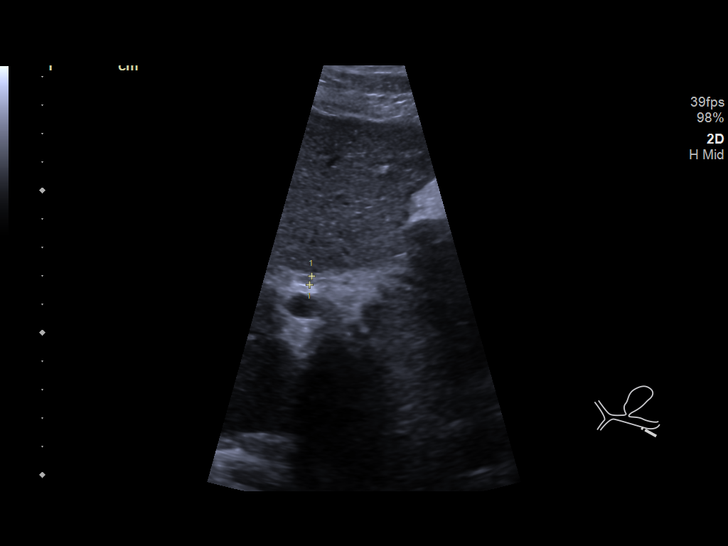
[im 19/36]
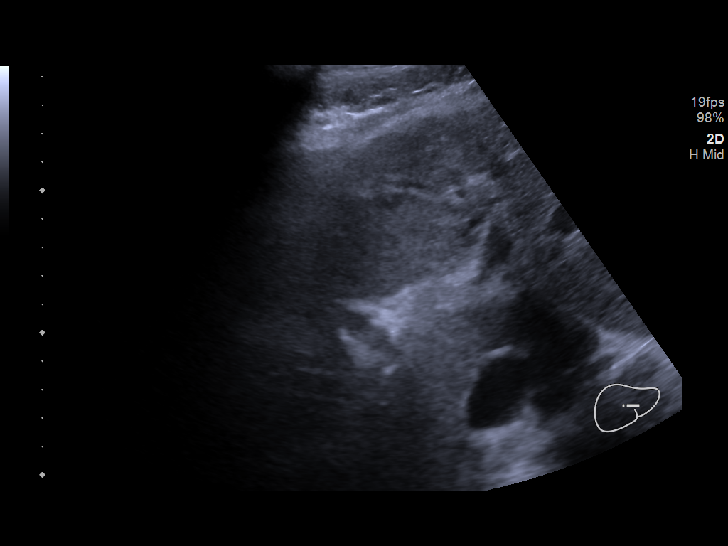
[im 22/36]
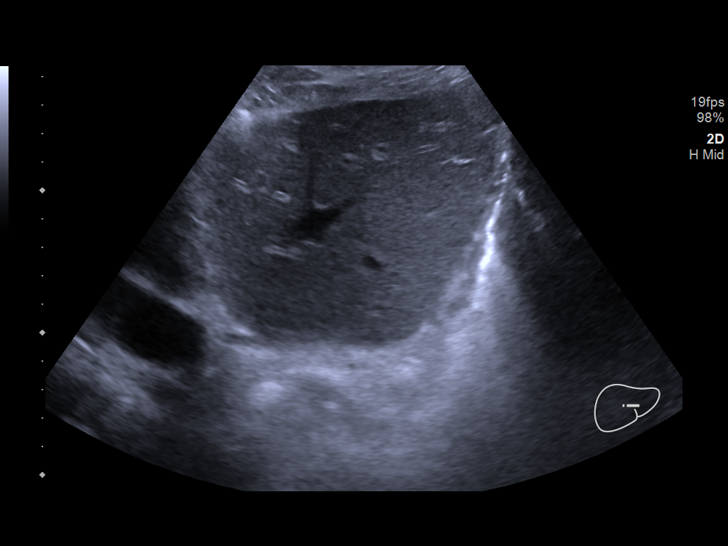
[im 24/36]
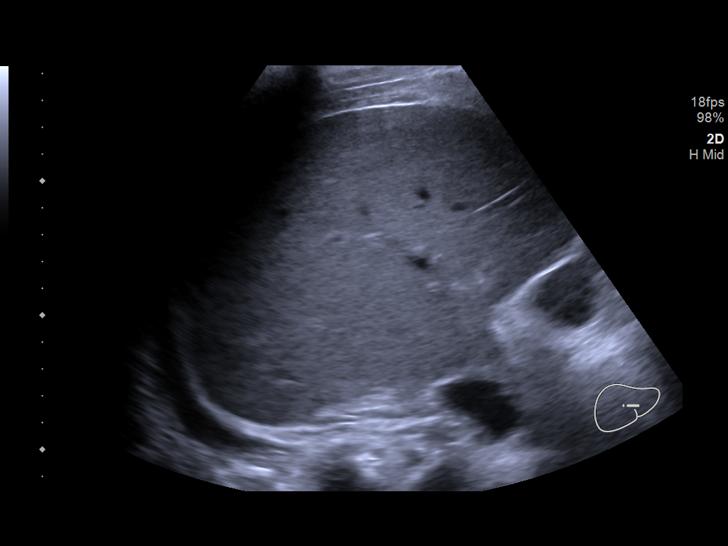
[im 27/36]
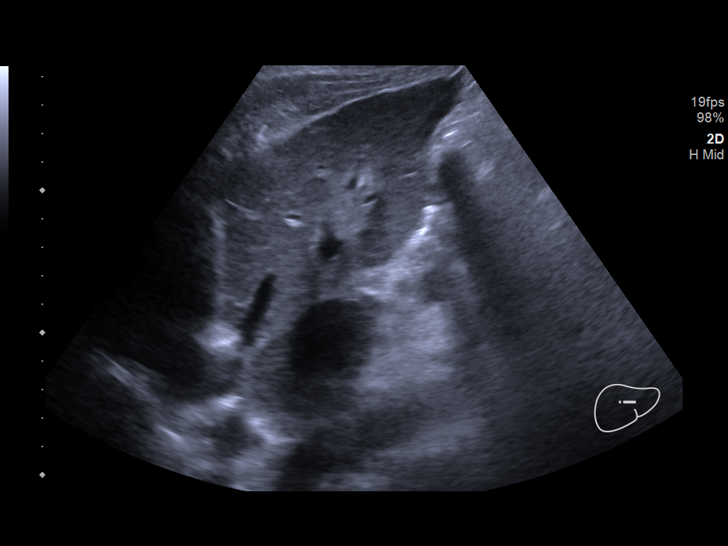
[im 30/36]
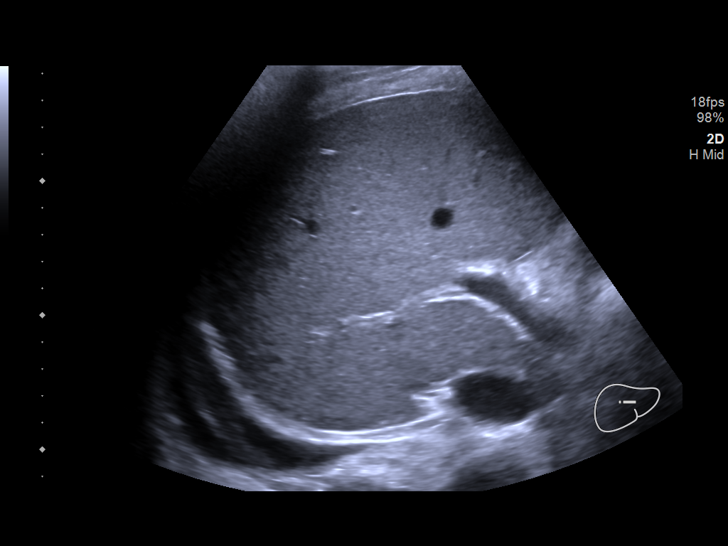
[im 33/36]
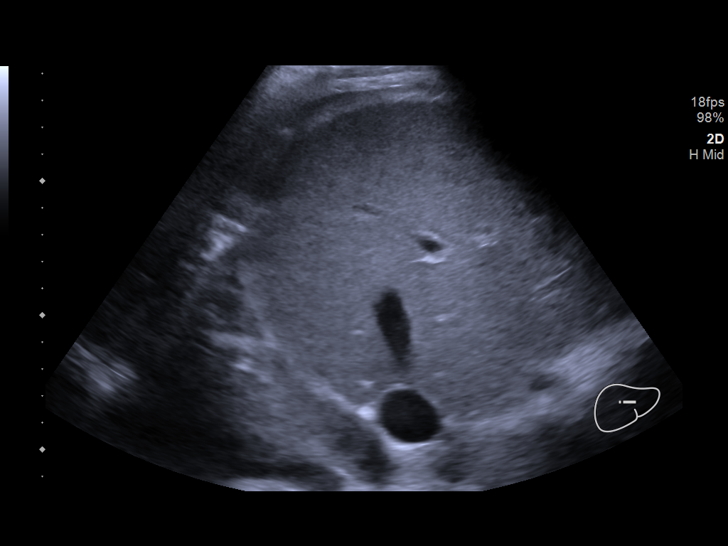
[im 36/36]
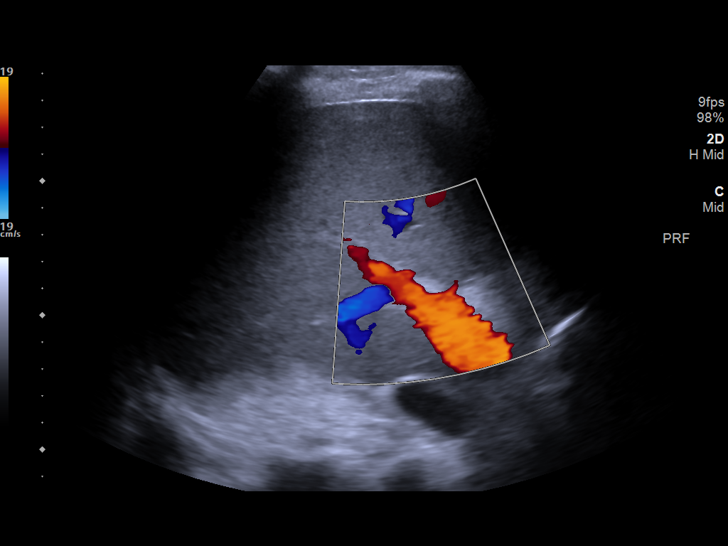

[14 of 25 positions shown; findings below may reference images not displayed]

FINDINGS: Gallbladder:

No gallstones or wall thickening visualized. No sonographic Murphy
sign noted by sonographer.

Common bile duct:

Diameter: 3.1 mm.

Liver:

No focal lesion identified. Within normal limits in parenchymal
echogenicity. Portal vein is patent on color Doppler imaging with
normal direction of blood flow towards the liver.

Other: Small right pleural effusion is noted similar to that seen on
prior CT examination.
IMPRESSION: Unremarkable right upper quadrant ultrasound.

Right pleural effusion similar to that seen on prior exam.

## 2020-09-22 IMAGING — CT CT ANGIO CHEST
2 of 6 series · 19 of 36 positions shown · IV contrast (omnipaque)
Comparison: Noncontrast CT from yesterday

CLINICAL DATA: Intermediate probability for pulmonary embolism.
Positive D-dimer

EXAM:
CT ANGIOGRAPHY CHEST WITH CONTRAST
TECHNIQUE: Multidetector CT imaging of the chest was performed using the
standard protocol during bolus administration of intravenous
contrast. Multiplanar CT image reconstructions and MIPs were
obtained to evaluate the vascular anatomy.
CONTRAST:  75mL OMNIPAQUE IOHEXOL 350 MG/ML SOLN

[Series 7: pe thins · axial · 0.78mm/px · z∈[+1012,+1254]mm · 18 of 385 slices shown]
[im 20/385  lung]
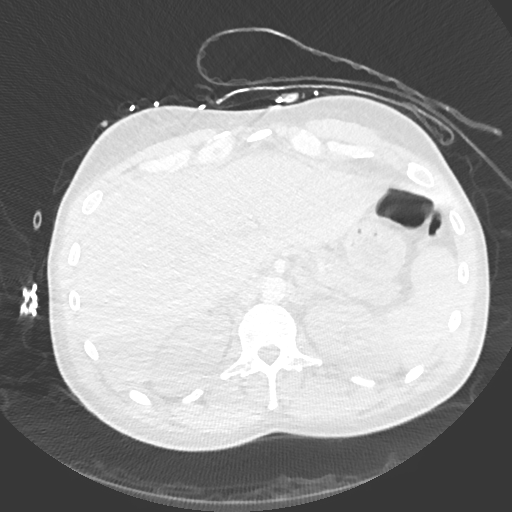
[im 39/385  mediastinal]
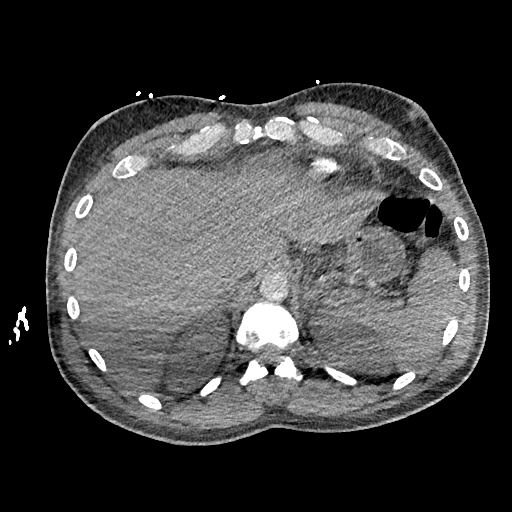
[im 58/385  lung]
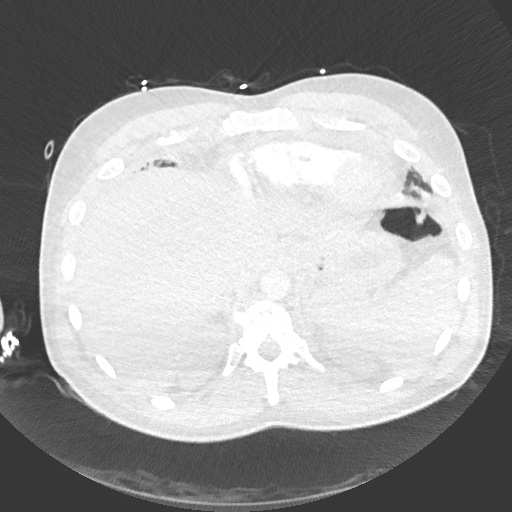
[im 77/385  mediastinal]
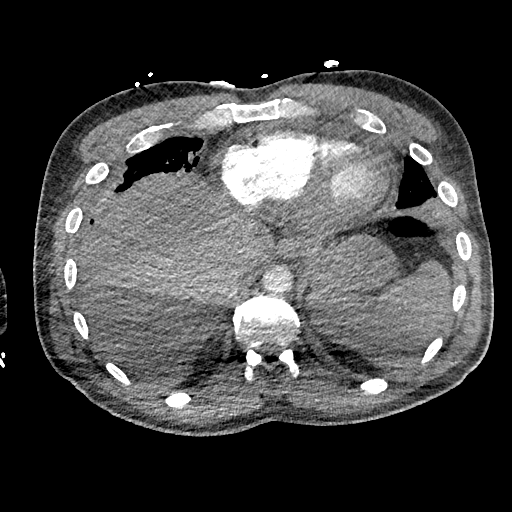
[im 97/385  lung]
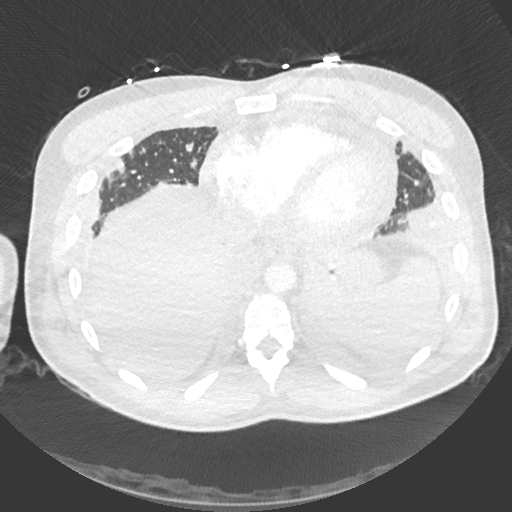
[im 116/385  mediastinal]
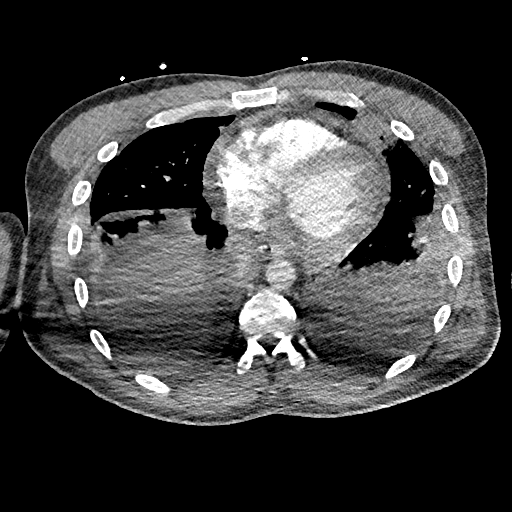
[im 135/385  lung]
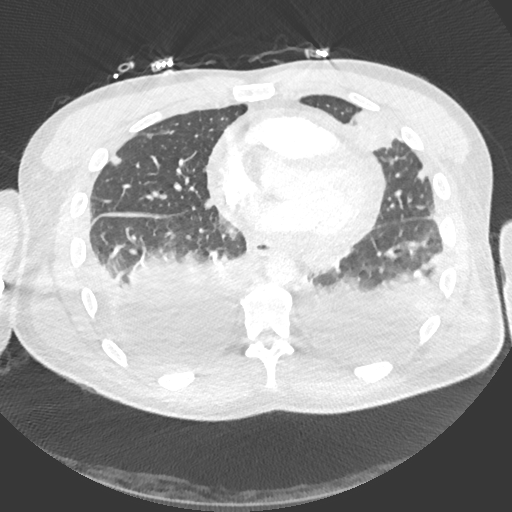
[im 154/385  mediastinal]
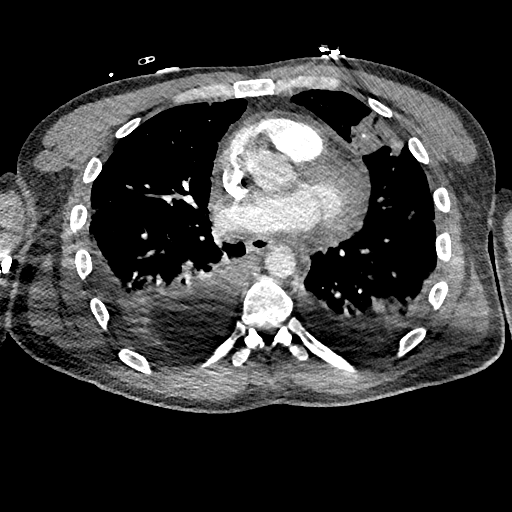
[im 173/385  lung]
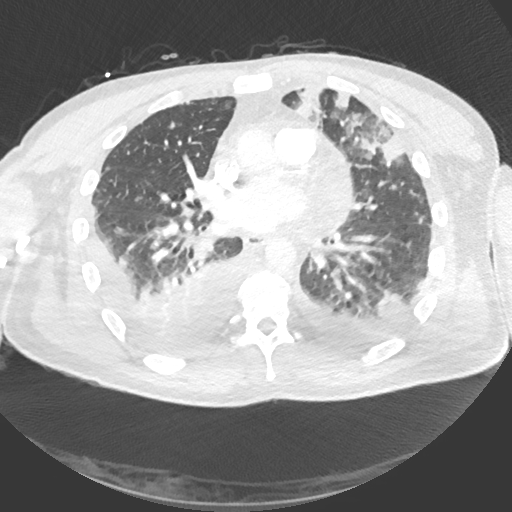
[im 212/385  mediastinal]
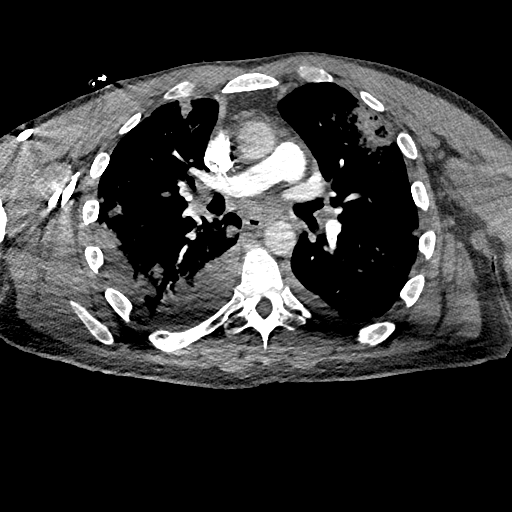
[im 231/385  lung]
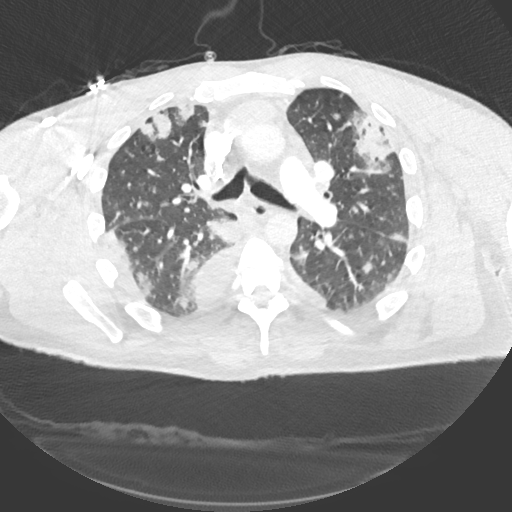
[im 250/385  mediastinal]
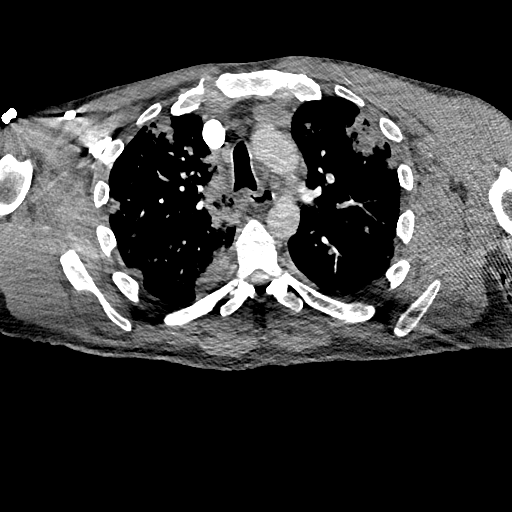
[im 269/385  lung]
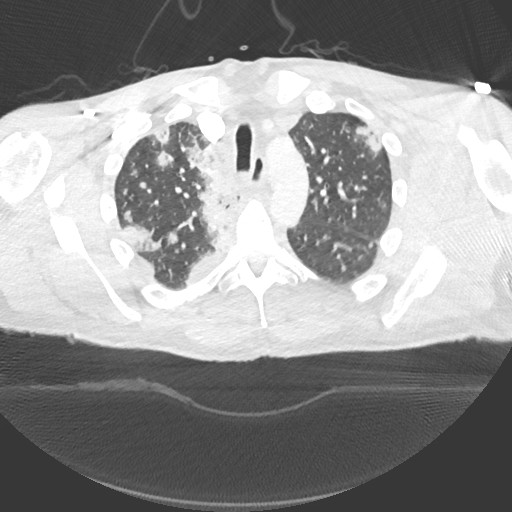
[im 289/385  mediastinal]
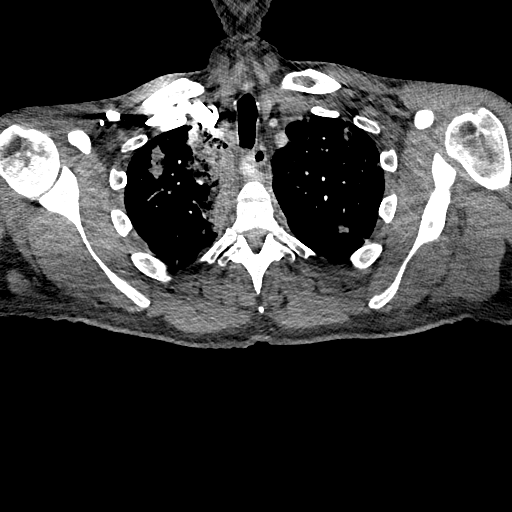
[im 308/385  lung]
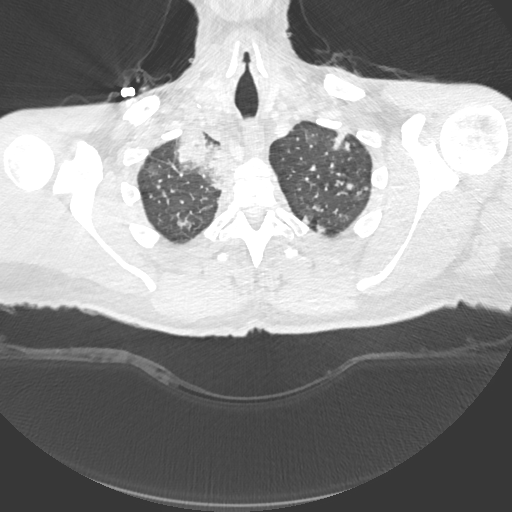
[im 327/385  mediastinal]
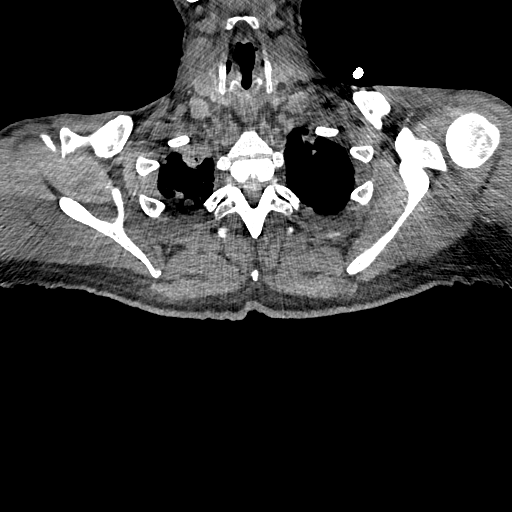
[im 346/385  lung]
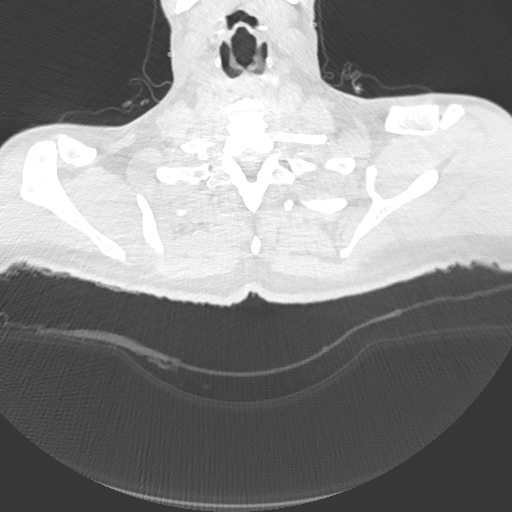
[im 365/385  mediastinal]
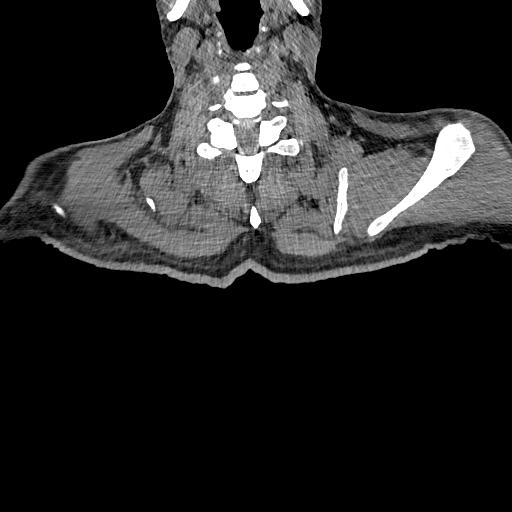

[Series 8: pe 2mm cor · coronal · 0.54mm/px · 1 of 151 slices shown]
[im 76/151  mediastinal]
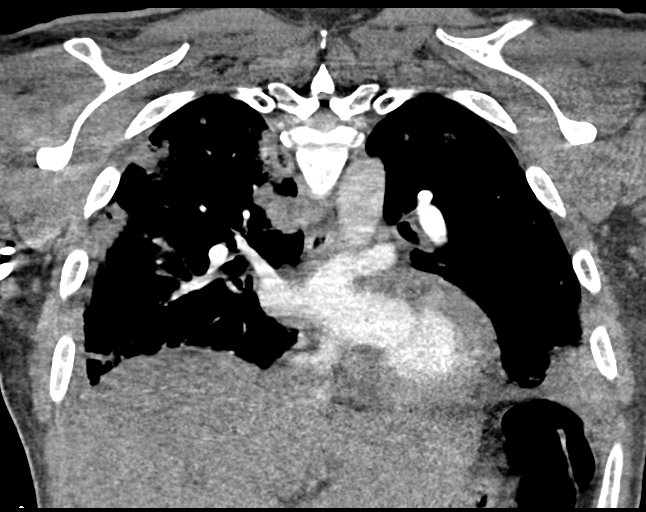

[19 of 36 positions shown; findings below may reference images not displayed]

FINDINGS: Cardiovascular: Normal heart size. Small pericardial effusion. There
is likely smooth serosal enhancement, certainty limited by contrast
timing. Patient has history of infective endocarditis per the chart.
Negative aorta. No pulmonary embolism is seen. Coronary
atherosclerosis that is notable for age.

Mediastinum/Nodes: Negative for adenopathy or collection.

Lungs/Pleura: Irregularly marginated rounded nodules and
consolidative opacities in the bilateral lungs with areas of central
cavitation that are non drainable. Bilateral small pleural effusion
with scalloping best seen on the right, progressed. No pneumothorax.

Upper Abdomen: Negative

Musculoskeletal: No evidence of spinal infection.

Review of the MIP images confirms the above findings.
IMPRESSION: 1. Multifocal cavitary pneumonia consistent with septic emboli. No
emboli large enough for visualization by CTA.
2. Small pleural effusions that are complex and increased from
yesterday, right larger than left.
3. Small pericardial effusion with possible pericarditis.

## 2020-09-26 IMAGING — DX DG CHEST 1V PORT
1 series · 1 of 1 positions shown · non-contrast
Comparison: Same day.

CLINICAL DATA: Chest tube placement.

EXAM:
PORTABLE CHEST 1 VIEW

[chest]
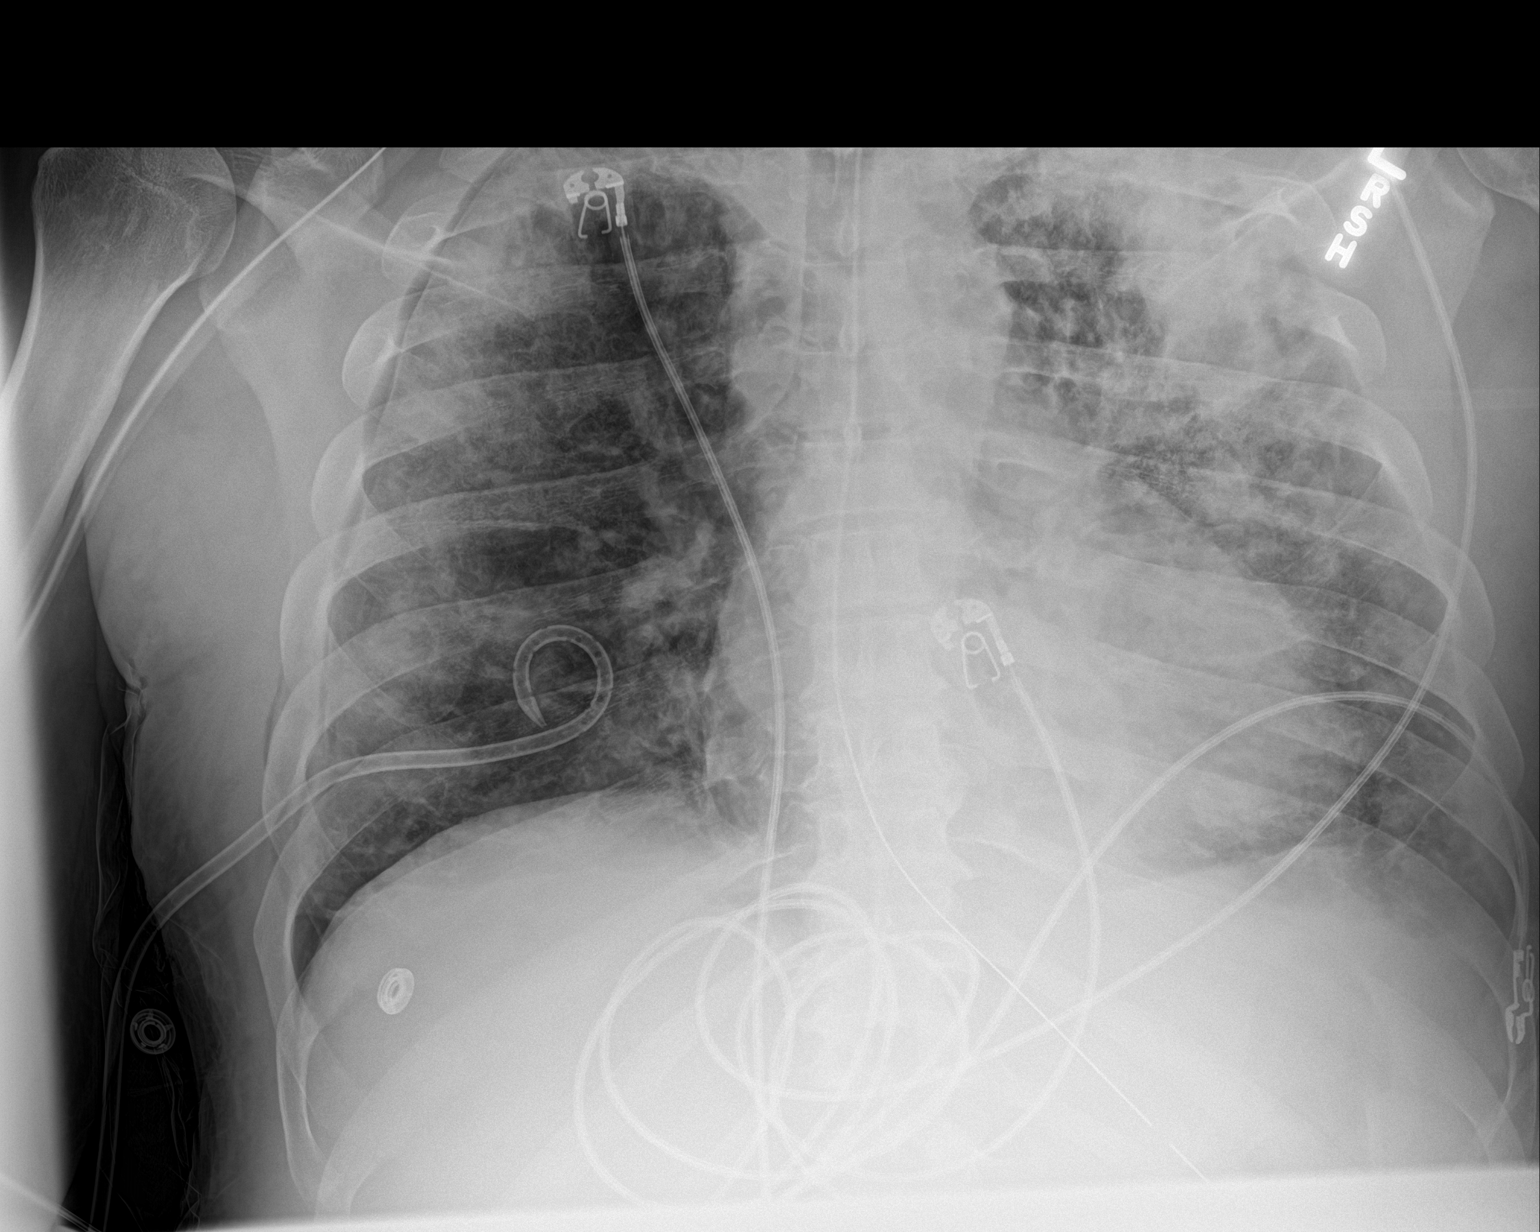

[1 of 1 positions shown; findings below may reference images not displayed]

FINDINGS: The heart size and mediastinal contours are within normal limits.
Endotracheal and nasogastric tubes are unchanged in position.
Interval placement of right pigtail drainage catheter into the right
hemithorax. Right pneumothorax is significantly smaller compared to
prior exam. Stable bilateral lung opacities are noted consistent
with pneumonia. Bony thorax is unremarkable. The visualized
skeletal structures are unremarkable.
IMPRESSION: Interval placement of right-sided chest tube. Right-sided
pneumothorax is significantly smaller in size. Otherwise stable
support apparatus. Stable bilateral lung opacities consistent with
pneumonia.

## 2020-09-27 IMAGING — CR DG CHEST 1V PORT
1 series · 1 of 1 positions shown · non-contrast
Comparison: 09/03/2019.

CLINICAL DATA: New chest tube insertion.

EXAM:
PORTABLE CHEST 1 VIEW

[AP]
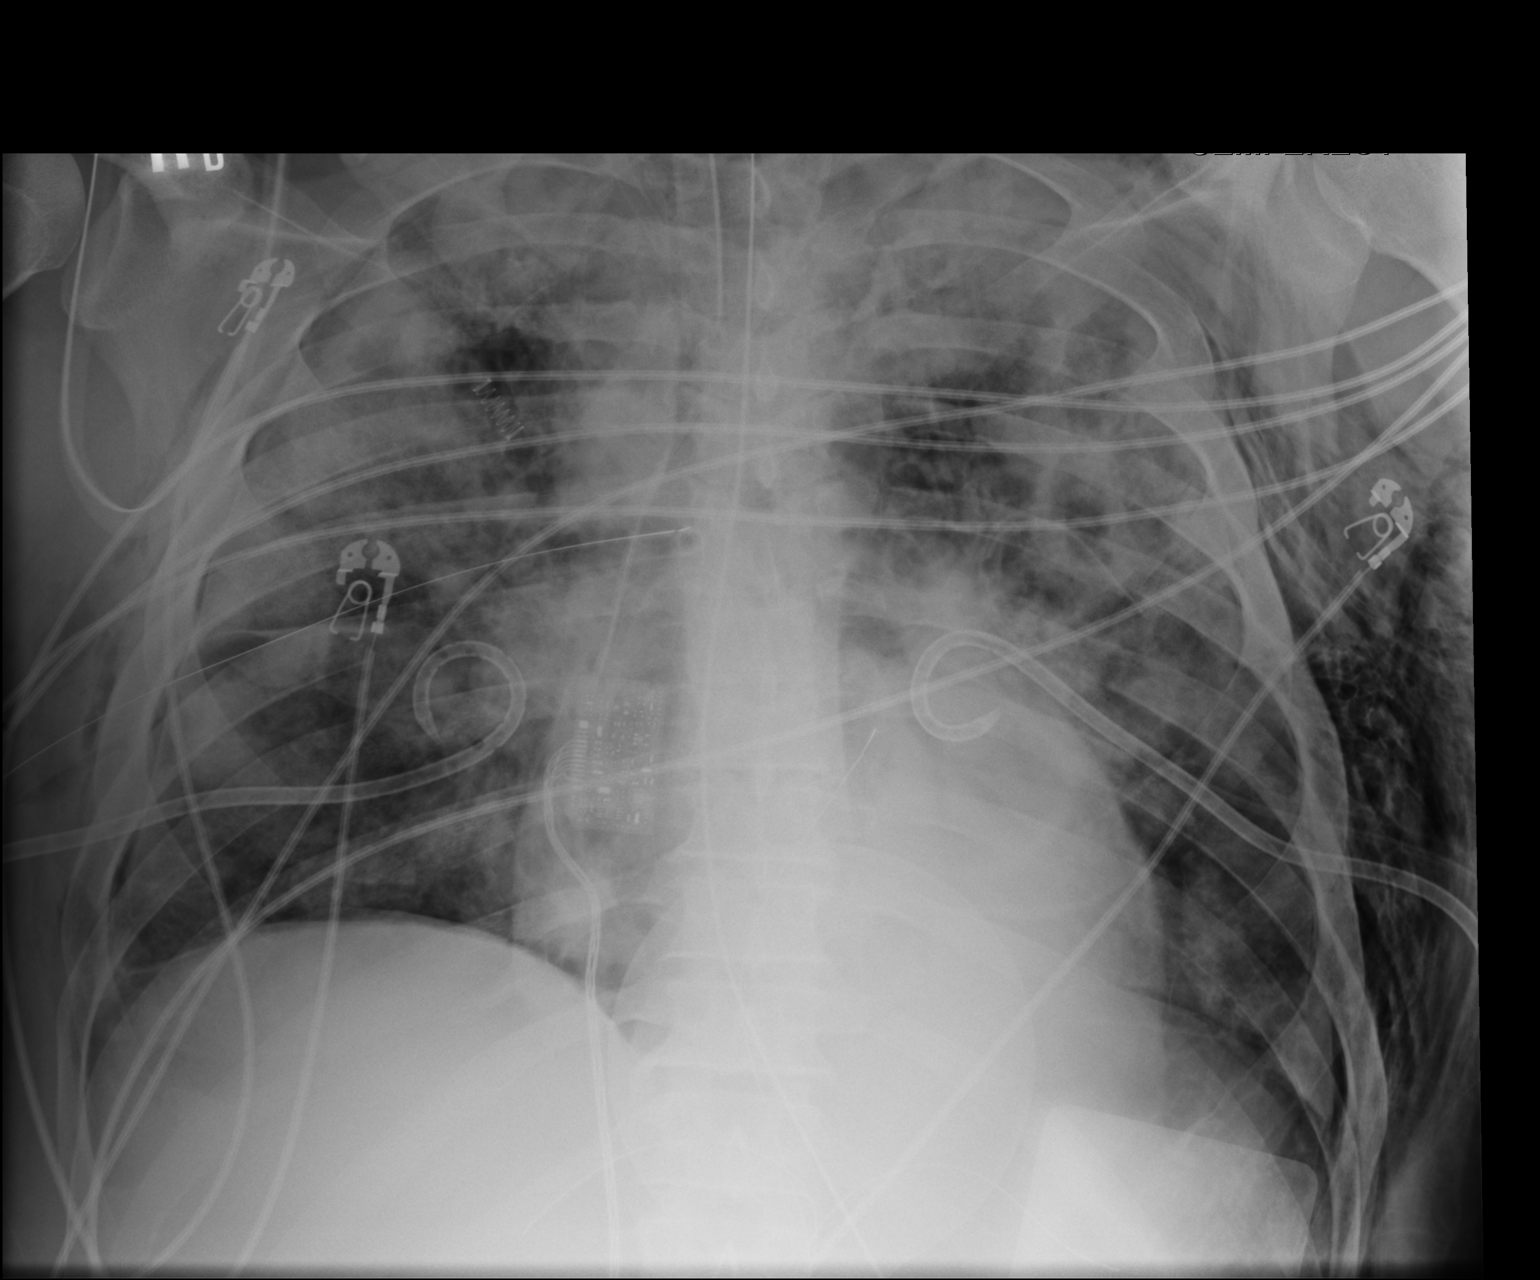

[1 of 1 positions shown; findings below may reference images not displayed]

FINDINGS: Endotracheal tube, NG tube, bilateral pigtail chest tubes, right
standard chest 2 in stable position. Interim improvement of left
pneumothorax with mild residual. Stable small right pneumothorax.
Progressive left chest wall subcutaneous emphysema. Decreased right
chest wall subcutaneous emphysema. Severe bilateral pulmonary
infiltrates again noted. Heart size stable.
IMPRESSION: 1. Lines and tubes including 2 right chest tubes and 1 left chest
tube in stable position.

2. Interim improvement of left pneumothorax with mild residual.
Stable small right pneumothorax. Progressive left chest wall
subcutaneous emphysema. Decreased right chest wall subcutaneous
emphysema.

3.  Severe bilateral pulmonary infiltrates unchanged.

## 2020-09-27 IMAGING — DX DG CHEST 1V PORT
1 series · 1 of 1 positions shown · non-contrast
Comparison: 09/03/2019.

CLINICAL DATA: Tachycardia.  Hypoxia.

EXAM:
PORTABLE CHEST 1 VIEW

[chest]
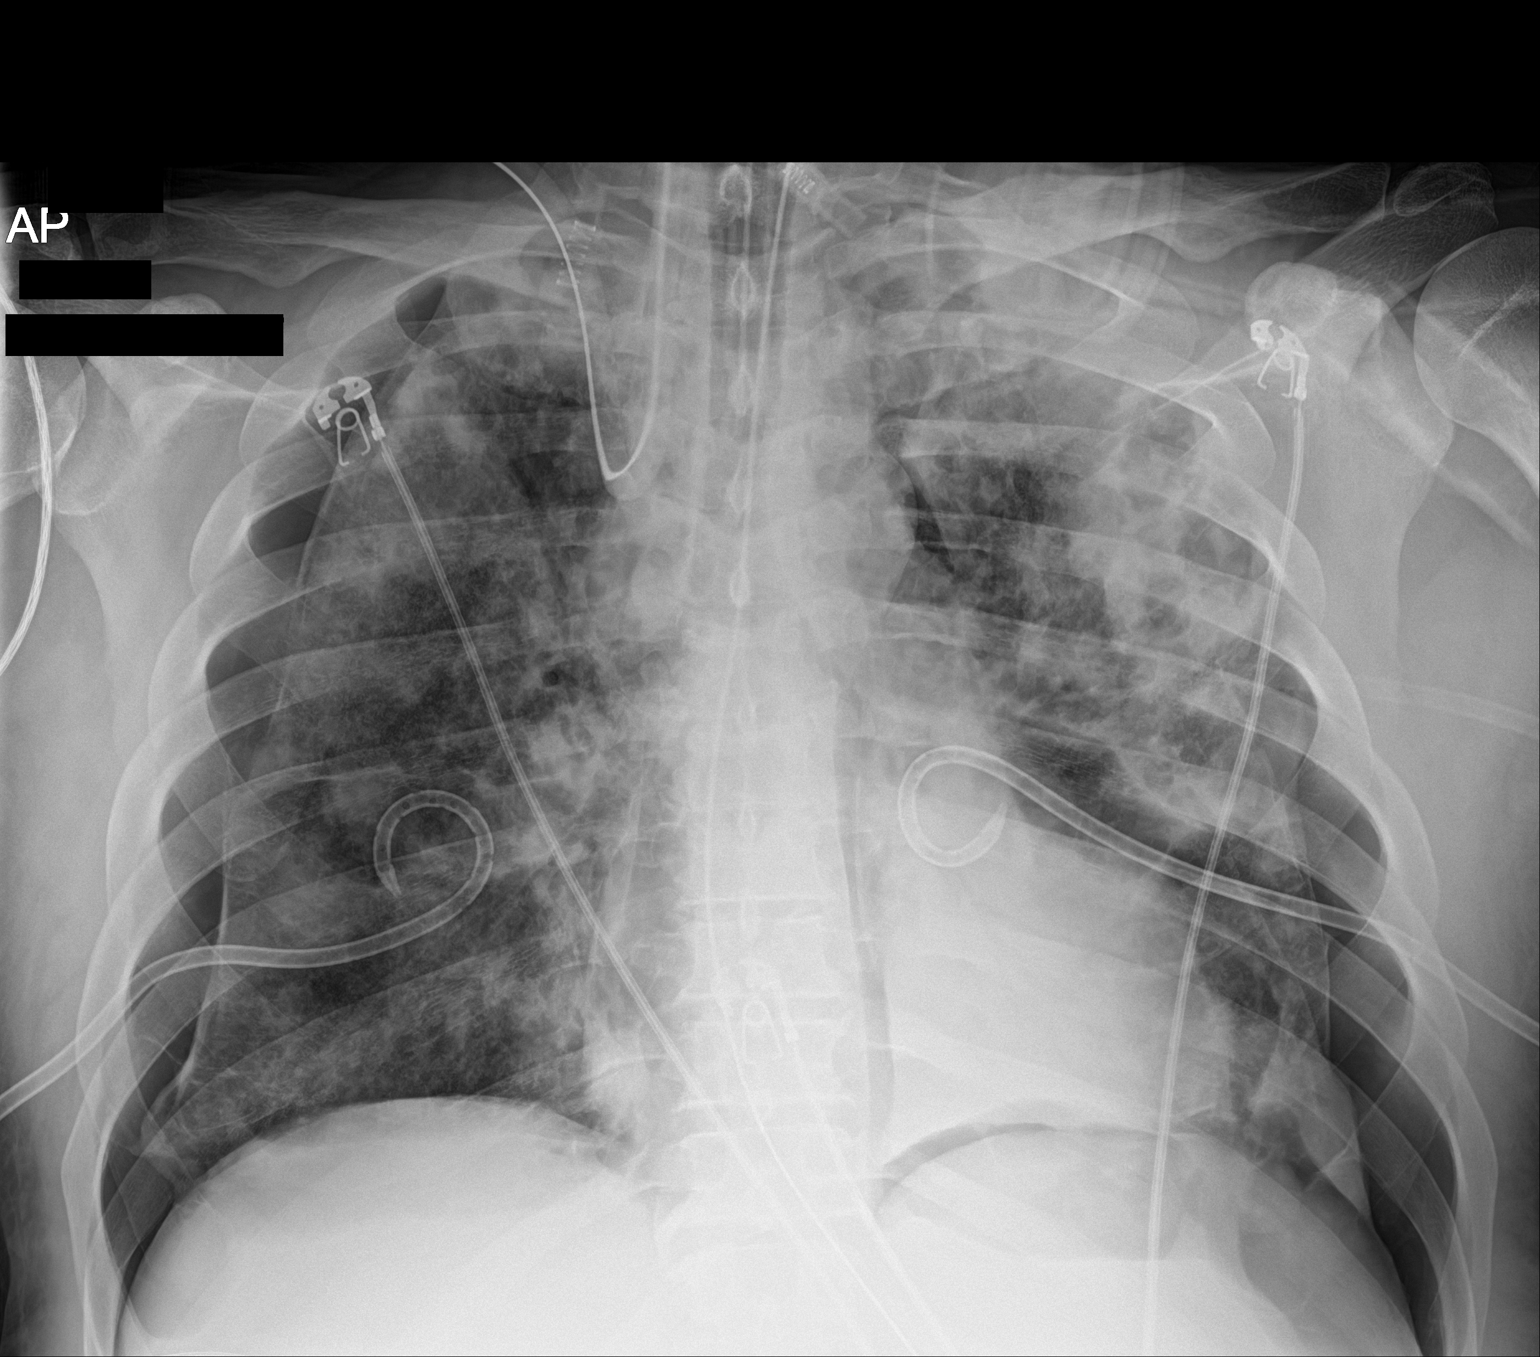

[1 of 1 positions shown; findings below may reference images not displayed]

FINDINGS: Endotracheal tube and NG tube in stable position. Bilateral chest
tubes in stable position. Heart size normal. Diffuse bilateral
infiltrates and atelectatic changes are again noted. Bilateral
pneumothoraces again noted. These have slightly increased in size
from prior exam.
IMPRESSION: 1. Bilateral chest tubes in stable position. Bilateral
pneumothoraces again noted. These have increased slightly in size
from prior exam. Endotracheal tube and NG tube in stable position.

2. Diffuse bilateral pulmonary infiltrates and atelectatic changes
again noted without interim change.
